# Patient Record
Sex: Female | Born: 1990
Health system: Southern US, Community
[De-identification: ages and names within clinical notes are randomized; demographics above are authoritative.]

## PROBLEM LIST (undated history)

## (undated) ENCOUNTER — Ambulatory Visit: Disposition: A | Discharge status: Home / Self Care | Payer: 59

## (undated) DIAGNOSIS — F32A Depression, unspecified: Secondary | ICD-10-CM

## (undated) DIAGNOSIS — E282 Polycystic ovarian syndrome: Secondary | ICD-10-CM

## (undated) DIAGNOSIS — F329 Major depressive disorder, single episode, unspecified: Secondary | ICD-10-CM

## (undated) DIAGNOSIS — F4310 Post-traumatic stress disorder, unspecified: Secondary | ICD-10-CM

## (undated) DIAGNOSIS — F3281 Premenstrual dysphoric disorder: Secondary | ICD-10-CM

## (undated) DIAGNOSIS — Z800 Family history of malignant neoplasm of digestive organs: Secondary | ICD-10-CM

## (undated) HISTORY — PX: WISDOM TOOTH EXTRACTION: SHX21

## (undated) HISTORY — DX: Family history of malignant neoplasm of digestive organs: Z80.0

## (undated) HISTORY — PX: CYSTECTOMY: SUR359

---
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
## 2015-11-24 ENCOUNTER — Emergency Department
Admission: EM | Admit: 2015-11-24 | Discharge: 2015-11-24 | Disposition: A | Discharge status: Home / Self Care | Payer: PRIVATE HEALTH INSURANCE | Attending: Student in an Organized Health Care Education/Training Program | Admitting: Student in an Organized Health Care Education/Training Program

## 2015-11-24 ENCOUNTER — Encounter: Payer: Self-pay | Admitting: Intensive Care

## 2015-11-24 DIAGNOSIS — F4310 Post-traumatic stress disorder, unspecified: Secondary | ICD-10-CM

## 2015-11-24 DIAGNOSIS — F329 Major depressive disorder, single episode, unspecified: Secondary | ICD-10-CM | POA: Diagnosis present

## 2015-11-24 DIAGNOSIS — R1110 Vomiting, unspecified: Secondary | ICD-10-CM | POA: Insufficient documentation

## 2015-11-24 DIAGNOSIS — F332 Major depressive disorder, recurrent severe without psychotic features: Secondary | ICD-10-CM

## 2015-11-24 DIAGNOSIS — F32A Depression, unspecified: Secondary | ICD-10-CM

## 2015-11-24 HISTORY — DX: Premenstrual dysphoric disorder: F32.81

## 2015-11-24 HISTORY — DX: Depression, unspecified: F32.A

## 2015-11-24 HISTORY — DX: Post-traumatic stress disorder, unspecified: F43.10

## 2015-11-24 HISTORY — DX: Polycystic ovarian syndrome: E28.2

## 2015-11-24 HISTORY — DX: Major depressive disorder, single episode, unspecified: F32.9

## 2015-11-24 LAB — COMPREHENSIVE METABOLIC PANEL
ALBUMIN: 4.4 g/dL (ref 3.5–5.0)
ALT: 24 U/L (ref 14–54)
AST: 20 U/L (ref 15–41)
Alkaline Phosphatase: 51 U/L (ref 38–126)
Anion gap: 7 (ref 5–15)
BILIRUBIN TOTAL: 0.8 mg/dL (ref 0.3–1.2)
BUN: 9 mg/dL (ref 6–20)
CHLORIDE: 106 mmol/L (ref 101–111)
CO2: 26 mmol/L (ref 22–32)
CREATININE: 1.04 mg/dL — AB (ref 0.44–1.00)
Calcium: 9.3 mg/dL (ref 8.9–10.3)
GFR calc Af Amer: >60 mL/min (ref >60)
GFR calc non Af Amer: >60 mL/min (ref >60)
GLUCOSE: 100 mg/dL — AB (ref 65–99)
POTASSIUM: 3.7 mmol/L (ref 3.5–5.1)
Sodium: 139 mmol/L (ref 135–145)
Total Protein: 7.6 g/dL (ref 6.5–8.1)

## 2015-11-24 LAB — URINALYSIS COMPLETE WITH MICROSCOPIC (ARMC ONLY)
BACTERIA UA: NONE SEEN
Bilirubin Urine: NEGATIVE
GLUCOSE, UA: NEGATIVE mg/dL
HGB URINE DIPSTICK: NEGATIVE
Leukocytes, UA: NEGATIVE
Nitrite: NEGATIVE
Protein, ur: NEGATIVE mg/dL
Specific Gravity, Urine: 1.025 (ref 1.005–1.030)
pH: 6 (ref 5.0–8.0)

## 2015-11-24 LAB — CBC
HEMATOCRIT: 45.2 % (ref 35.0–47.0)
Hemoglobin: 15.6 g/dL (ref 12.0–16.0)
MCH: 33.1 pg (ref 26.0–34.0)
MCHC: 34.5 g/dL (ref 32.0–36.0)
MCV: 95.8 fL (ref 80.0–100.0)
PLATELETS: 200 10*3/uL (ref 150–440)
RBC: 4.72 MIL/uL (ref 3.80–5.20)
RDW: 13.8 % (ref 11.5–14.5)
WBC: 8.7 10*3/uL (ref 3.6–11.0)

## 2015-11-24 LAB — POCT PREGNANCY, URINE: PREG TEST UR: NEGATIVE

## 2015-11-24 LAB — LIPASE, BLOOD: LIPASE: 24 U/L (ref 11–51)

## 2015-11-24 MED ORDER — ONDANSETRON 4 MG PO TBDP
ORAL_TABLET | ORAL | Status: AC
  Administered 2015-11-24: 4 mg via ORAL
  Filled 2015-11-24: qty 1

## 2015-11-24 MED ORDER — ONDANSETRON 4 MG PO TBDP
4.0000 mg | ORAL_TABLET | Freq: Once | ORAL | Status: AC | PRN
  Administered 2015-11-24: 4 mg via ORAL

## 2015-11-24 MED ORDER — LORAZEPAM 1 MG PO TABS
1.0000 mg | ORAL_TABLET | Freq: Once | ORAL | Status: AC
  Administered 2015-11-24: 1 mg via ORAL
  Filled 2015-11-24: qty 1

## 2015-11-24 MED ORDER — LORAZEPAM 0.5 MG PO TABS: 0.5000 mg | ORAL_TABLET | Freq: Three times a day (TID) | ORAL | 0 refills | Status: AC | PRN

## 2015-11-24 NOTE — ED Provider Notes (Signed)
[facility] Emergency Department Provider Note    First MD Initiated Contact with Patient 11/24/15 1531     (approximate)  I have reviewed the triage vital signs and the nursing notes.   HISTORY  Chief Complaint Emesis; Abdominal Pain; and Depression    HPI Pamela Kent is a 25 y.o. female history of severe depression, PTSD as well as PMDD not on any antidepressant medications presents with severe anhedonia, insomnia and feelings of grief. Patient just recently graduated from graduate school down in [place].  This has been a very stressful time for the patient she does not have any employment at this time. She also states that her boyfriend committed suicide last week and since then she's been having worsening feelings of grief and uncontrollable crying. She feels hopeless at alone. States that she is also feeling more agitated and irritable with family members and loved ones. Denies any hallucinations. Denies any suicide ideations but will have thoughts that death may be better than the way she currently feels. Does not have a plan.   Past Medical History:  Diagnosis Date  . Depression   . PCOS (polycystic ovarian syndrome)   . PMDD (premenstrual dysphoric disorder)   . PTSD (post-traumatic stress disorder)     There are no active problems to display for this patient.   Past Surgical History:  Procedure Laterality Date  . CYSTECTOMY      Prior to Admission medications   Not on File    Allergies Review of patient's allergies indicates no known allergies.  History reviewed. No pertinent family history.  Social History Social History  Substance Use Topics  . Smoking status: Never Smoker  . Smokeless tobacco: Never Used  . Alcohol use Yes     Comment: Occ on celebrations or holiday    Review of Systems Patient denies headaches, rhinorrhea, blurry vision, numbness, shortness of breath, chest pain, edema, cough, abdominal pain, nausea,  vomiting, diarrhea, dysuria, fevers, rashes or hallucinations unless otherwise stated above in HPI. ____________________________________________   PHYSICAL EXAM:  VITAL SIGNS: Vitals:   11/24/15 1358  BP: 121/85  Pulse: 84  Resp: 20  Temp: 98.6 F (37 C)    Constitutional: Alert and oriented. Well appearing and in no acute distress. Eyes: Conjunctivae are normal. PERRL. EOMI. Head: Atraumatic. Nose: No congestion/rhinnorhea. Mouth/Throat: Mucous membranes are moist.  Oropharynx non-erythematous. Neck: No stridor. Painless ROM. No cervical spine tenderness to palpation Hematological/Lymphatic/Immunilogical: No cervical lymphadenopathy. Cardiovascular: Normal rate, regular rhythm. Grossly normal heart sounds.  Good peripheral circulation. Respiratory: Normal respiratory effort.  No retractions. Lungs CTAB. Gastrointestinal: Soft and nontender. No distention. No abdominal bruits. No CVA tenderness. Genitourinary:  Musculoskeletal: No lower extremity tenderness nor edema.  No joint effusions. Neurologic:  Normal speech and language. No gross focal neurologic deficits are appreciated. No gait instability. Skin:  Skin is warm, dry and intact. No rash noted. Psychiatric: Mood depressed and affect is tearfull. Speech and behavior are normal.  ____________________________________________   LABS (all labs ordered are listed, but only abnormal results are displayed)  Results for orders placed or performed during the hospital encounter of 11/24/15 (from the past 24 hour(s))  Lipase, blood     Status: None   Collection Time: 11/24/15  2:04 PM  Result Value Ref Range   Lipase 24 11 - 51 U/L  Comprehensive metabolic panel     Status: Abnormal   Collection Time: 11/24/15  2:04 PM  Result Value Ref Range   Sodium  139 135 - 145 mmol/L   Potassium 3.7 3.5 - 5.1 mmol/L   Chloride 106 101 - 111 mmol/L   CO2 26 22 - 32 mmol/L   Glucose, Bld 100 (H) 65 - 99 mg/dL   BUN 9 6 - 20 mg/dL    Creatinine, Ser 1.611.04 (H) 0.44 - 1.00 mg/dL   Calcium 9.3 8.9 - 09.610.3 mg/dL   Total Protein 7.6 6.5 - 8.1 g/dL   Albumin 4.4 3.5 - 5.0 g/dL   AST 20 15 - 41 U/L   ALT 24 14 - 54 U/L   Alkaline Phosphatase 51 38 - 126 U/L   Total Bilirubin 0.8 0.3 - 1.2 mg/dL   GFR calc non Af Amer >60 >60 mL/min   GFR calc Af Amer >60 >60 mL/min   Anion gap 7 5 - 15  CBC     Status: None   Collection Time: 11/24/15  2:04 PM  Result Value Ref Range   WBC 8.7 3.6 - 11.0 K/uL   RBC 4.72 3.80 - 5.20 MIL/uL   Hemoglobin 15.6 12.0 - 16.0 g/dL   HCT 04.545.2 40.935.0 - 81.147.0 %   MCV 95.8 80.0 - 100.0 fL   MCH 33.1 26.0 - 34.0 pg   MCHC 34.5 32.0 - 36.0 g/dL   RDW 91.413.8 78.211.5 - 95.614.5 %   Platelets 200 150 - 440 K/uL  Pregnancy, urine POC     Status: None   Collection Time: 11/24/15  3:43 PM  Result Value Ref Range   Preg Test, Ur NEGATIVE NEGATIVE   ____________________________________________  EKG My review and personal interpretation at Time: 14:08   Indication: medication screen  Rate: 60  Rhythm: nsr Axis: normal Other: normal ekg, normal intervals ____________________________________________  RADIOLOGY   ____________________________________________   PROCEDURES  Procedure(s) performed: none    Critical Care performed: no ____________________________________________   INITIAL IMPRESSION / ASSESSMENT AND PLAN / ED COURSE  Pertinent labs & imaging results that were available during my care of the patient were reviewed by me and considered in my medical decision making (see chart for details).  DDX: Psychosis, delirium, medication effect, noncompliance, polysubstance abuse, Si, Hi, depression  Pamela Kent is a 25 y.o. who presents to the ED with for evaluation of severe depression, anhedonia, and grieving.  Patient has psych history of depression, PTSD. She denies any active SI or HI but is clearly suffering quite acute rate deal. Also reporting abdominal pain and nausea that is secondary to  thoughts about her recently deceased boyfriend. Her abdominal exam soft and benign. Laboratory testing was ordered to evaluation for underlying electrolyte derangement or signs of underlying organic pathology to explain today's presentation.  Based on history and physical and laboratory evaluation, it appears that the patient's presentation is 2/2 underlying psychiatric disorder and will require further evaluation and management by inpatient psychiatry.  Patient was not made an IVC due to no active SI or HI.  Disposition pending psychiatric evaluation.  I will consult psychiatry for evaluation and management.   Clinical Course    Patient evaluated by psychiatry at bedside and was offered voluntary admission but the patient declined preferring to restart her medications and follow-up with her psychiatrist in [place].  Have discussed with the patient and available family all diagnostics and treatments performed thus far and all questions were answered to the best of my ability. The patient demonstrates understanding and agreement with plan.  ____________________________________________   FINAL CLINICAL IMPRESSION(S) / ED DIAGNOSES  Final diagnoses:  Depression      NEW MEDICATIONS STARTED DURING THIS VISIT:  New Prescriptions   No medications on file     Note:  This document was prepared using Dragon voice recognition software and may include unintentional dictation errors.    Willy Eddy, MD 11/24/15 2146
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
## 2015-11-24 NOTE — ED Notes (Signed)
Pt denies SI or any thoughts of wanting to hurt herself. Pt stated she was sad and upset and had a flat affect and soft spoken.
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
## 2015-11-24 NOTE — ED Triage Notes (Signed)
Patient states she went to the doctor Friday for not being able to eat or sleep. Doctor diagnosed pt with depression and placed on Remeron. Patient reports "my bf committed suicide last Wednesday and I feel very upset and empty. I cannot control my emotions and the way I feel on the inside is making me very sick" Patient states she is able to keep food and liquids down. Just has emesis when she thinks about everything going on. Patient crying in triage.

## 2016-03-05 ENCOUNTER — Emergency Department: Payer: PRIVATE HEALTH INSURANCE

## 2016-03-05 ENCOUNTER — Emergency Department
Admission: EM | Admit: 2016-03-05 | Discharge: 2016-03-05 | Disposition: A | Discharge status: Home / Self Care | Payer: PRIVATE HEALTH INSURANCE | Attending: Emergency Medicine | Admitting: Emergency Medicine

## 2016-03-05 ENCOUNTER — Encounter: Payer: Self-pay | Admitting: Intensive Care

## 2016-03-05 DIAGNOSIS — Y999 Unspecified external cause status: Secondary | ICD-10-CM | POA: Insufficient documentation

## 2016-03-05 DIAGNOSIS — S0990XA Unspecified injury of head, initial encounter: Secondary | ICD-10-CM | POA: Insufficient documentation

## 2016-03-05 DIAGNOSIS — Y929 Unspecified place or not applicable: Secondary | ICD-10-CM | POA: Insufficient documentation

## 2016-03-05 DIAGNOSIS — M25512 Pain in left shoulder: Secondary | ICD-10-CM | POA: Insufficient documentation

## 2016-03-05 DIAGNOSIS — Y9389 Activity, other specified: Secondary | ICD-10-CM | POA: Insufficient documentation

## 2016-03-05 LAB — COMPREHENSIVE METABOLIC PANEL
ALBUMIN: 3.9 g/dL (ref 3.5–5.0)
ALK PHOS: 61 U/L (ref 38–126)
ALT: 25 U/L (ref 14–54)
ANION GAP: 6 (ref 5–15)
AST: 25 U/L (ref 15–41)
BILIRUBIN TOTAL: 0.3 mg/dL (ref 0.3–1.2)
BUN: 8 mg/dL (ref 6–20)
CALCIUM: 8.9 mg/dL (ref 8.9–10.3)
CO2: 23 mmol/L (ref 22–32)
Chloride: 109 mmol/L (ref 101–111)
Creatinine, Ser: 0.94 mg/dL (ref 0.44–1.00)
GFR calc non Af Amer: >60 mL/min (ref >60)
Glucose, Bld: 93 mg/dL (ref 65–99)
POTASSIUM: 3.9 mmol/L (ref 3.5–5.1)
SODIUM: 138 mmol/L (ref 135–145)
TOTAL PROTEIN: 7.4 g/dL (ref 6.5–8.1)

## 2016-03-05 LAB — CBC
HEMATOCRIT: 44.9 % (ref 35.0–47.0)
HEMOGLOBIN: 15.2 g/dL (ref 12.0–16.0)
MCH: 32.5 pg (ref 26.0–34.0)
MCHC: 33.7 g/dL (ref 32.0–36.0)
MCV: 96.3 fL (ref 80.0–100.0)
Platelets: 225 10*3/uL (ref 150–440)
RBC: 4.67 MIL/uL (ref 3.80–5.20)
RDW: 13.4 % (ref 11.5–14.5)
WBC: 14.5 10*3/uL — ABNORMAL HIGH (ref 3.6–11.0)

## 2016-03-05 MED ORDER — TRAMADOL HCL 50 MG PO TABS
50.0000 mg | ORAL_TABLET | Freq: Once | ORAL | Status: AC
  Administered 2016-03-05: 50 mg via ORAL
  Filled 2016-03-05: qty 1

## 2016-03-05 MED ORDER — OXYCODONE-ACETAMINOPHEN 5-325 MG PO TABS: 1.0000 | ORAL_TABLET | Freq: Four times a day (QID) | ORAL | 0 refills | Status: DC | PRN

## 2016-03-05 MED ORDER — OXYCODONE-ACETAMINOPHEN 5-325 MG PO TABS
1.0000 | ORAL_TABLET | Freq: Once | ORAL | Status: AC
  Administered 2016-03-05: 1 via ORAL
  Filled 2016-03-05: qty 1
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
## 2016-03-05 NOTE — ED Notes (Signed)
[employer] in room

## 2016-05-22 ENCOUNTER — Encounter (HOSPITAL_COMMUNITY): Payer: Self-pay | Admitting: Emergency Medicine

## 2016-05-22 ENCOUNTER — Emergency Department (HOSPITAL_COMMUNITY)
Admission: EM | Admit: 2016-05-22 | Discharge: 2016-05-22 | Disposition: A | Discharge status: Home / Self Care | Payer: PRIVATE HEALTH INSURANCE | Attending: Emergency Medicine | Admitting: Emergency Medicine

## 2016-05-22 DIAGNOSIS — R109 Unspecified abdominal pain: Secondary | ICD-10-CM

## 2016-05-22 DIAGNOSIS — R195 Other fecal abnormalities: Secondary | ICD-10-CM | POA: Diagnosis present

## 2016-05-22 LAB — CBC
HEMATOCRIT: 42.4 % (ref 36.0–46.0)
HEMOGLOBIN: 14.4 g/dL (ref 12.0–15.0)
MCH: 32.4 pg (ref 26.0–34.0)
MCHC: 34 g/dL (ref 30.0–36.0)
MCV: 95.5 fL (ref 78.0–100.0)
Platelets: 222 10*3/uL (ref 150–400)
RBC: 4.44 MIL/uL (ref 3.87–5.11)
RDW: 14.2 % (ref 11.5–15.5)
WBC: 6.6 10*3/uL (ref 4.0–10.5)

## 2016-05-22 LAB — POC OCCULT BLOOD, ED: FECAL OCCULT BLD: NEGATIVE

## 2016-05-22 LAB — COMPREHENSIVE METABOLIC PANEL
ALT: 55 U/L — ABNORMAL HIGH (ref 14–54)
ANION GAP: 5 (ref 5–15)
AST: 40 U/L (ref 15–41)
Albumin: 3.7 g/dL (ref 3.5–5.0)
Alkaline Phosphatase: 55 U/L (ref 38–126)
BUN: 8 mg/dL (ref 6–20)
CHLORIDE: 107 mmol/L (ref 101–111)
CO2: 25 mmol/L (ref 22–32)
Calcium: 8.9 mg/dL (ref 8.9–10.3)
Creatinine, Ser: 0.91 mg/dL (ref 0.44–1.00)
GFR calc Af Amer: >60 mL/min (ref >60)
GFR calc non Af Amer: >60 mL/min (ref >60)
Glucose, Bld: 101 mg/dL — ABNORMAL HIGH (ref 65–99)
POTASSIUM: 3.9 mmol/L (ref 3.5–5.1)
Sodium: 137 mmol/L (ref 135–145)
TOTAL PROTEIN: 7 g/dL (ref 6.5–8.1)
Total Bilirubin: 0.6 mg/dL (ref 0.3–1.2)

## 2016-05-22 LAB — LIPASE, BLOOD: LIPASE: 18 U/L (ref 11–51)

## 2016-05-22 LAB — I-STAT BETA HCG BLOOD, ED (MC, WL, AP ONLY)

## 2016-05-22 MED ORDER — DICYCLOMINE HCL 20 MG PO TABS: 20.0000 mg | ORAL_TABLET | Freq: Two times a day (BID) | ORAL | 0 refills | Status: DC

## 2016-05-22 NOTE — ED Provider Notes (Signed)
[facility]-EMERGENCY DEPT Provider Note   CSN: [id number] Arrival date & time: 05/22/16  [id number]     History   Chief Complaint Chief Complaint  Patient presents with  . Blood In Stools    HPI Pamela Kent is a 26 y.o. female.  HPI Pt has been having some stomach cramping.  This started on Thursday.  She noticed dark color to her stools the last couple of days.  She usually has loose stools but no change in that pattern. She googled and read it could be blood so she came to get checked.  No fevers.  No vomiting.   No pepto bismol.  Has been eating more fast food and restaurant food recently.  No pain or symptoms now.  Past Medical History:  Diagnosis Date  . Depression   . PCOS (polycystic ovarian syndrome)   . PMDD (premenstrual dysphoric disorder)   . PTSD (post-traumatic stress disorder)     Patient Active Problem List   Diagnosis Date Noted  . Severe recurrent major depression without psychotic features (HCC) 11/24/2015  . PTSD (post-traumatic stress disorder) 11/24/2015    Past Surgical History:  Procedure Laterality Date  . CYSTECTOMY      OB History    No data available       Home Medications    Prior to Admission medications   Medication Sig Start Date End Date Taking? Authorizing Provider  dicyclomine (BENTYL) 20 MG tablet Take 1 tablet (20 mg total) by mouth 2 (two) times daily. 05/22/16   Linwood DibblesJon Elvin Banker, MD  LORazepam (ATIVAN) 0.5 MG tablet Take 1 tablet (0.5 mg total) by mouth every 8 (eight) hours as needed for anxiety. 11/24/15 11/23/16  Willy EddyPatrick Robinson, MD  oxyCODONE-acetaminophen (ROXICET) 5-325 MG tablet Take 1 tablet by mouth every 6 (six) hours as needed for severe pain. 03/05/16   Enid DerryAshley Wagner, PA-C    Family History No family history on file.  Social History Social History  Substance Use Topics  . Smoking status: Never Smoker  . Smokeless tobacco: Never Used  . Alcohol use Yes     Comment: Occ on celebrations or holiday     Allergies     Patient has no known allergies.   Review of Systems Review of Systems  All other systems reviewed and are negative.    Physical Exam Updated Vital Signs BP 126/80 (BP Location: Left Arm)   Pulse 60   Temp 97.6 F (36.4 C) (Oral)   Resp 18   Ht 5\' 4"  (1.626 m)   Wt 76.2 kg   LMP 05/13/2016   SpO2 100%   BMI 28.84 kg/m   Physical Exam  Constitutional: She appears well-developed and well-nourished. No distress.  HENT:  Head: Normocephalic and atraumatic.  Right Ear: External ear normal.  Left Ear: External ear normal.  Eyes: Conjunctivae are normal. Right eye exhibits no discharge. Left eye exhibits no discharge. No scleral icterus.  Neck: Neck supple. No tracheal deviation present.  Cardiovascular: Normal rate, regular rhythm and intact distal pulses.   Pulmonary/Chest: Effort normal and breath sounds normal. No stridor. No respiratory distress. She has no wheezes. She has no rales.  Abdominal: Soft. Bowel sounds are normal. She exhibits no distension. There is no tenderness. There is no rebound and no guarding.  Genitourinary:  Genitourinary Comments: Tremper stool, no blood  Musculoskeletal: She exhibits no edema or tenderness.  Neurological: She is alert. She has normal strength. No cranial nerve deficit (no facial droop, extraocular movements intact, no  slurred speech) or sensory deficit. She exhibits normal muscle tone. She displays no seizure activity. Coordination normal.  Skin: Skin is warm and dry. No rash noted.  Psychiatric: She has a normal mood and affect.  Nursing note and vitals reviewed.    ED Treatments / Results    Procedures Procedures (including critical care time)  Medications Ordered in ED Medications - No data to display   Initial Impression / Assessment and Plan / ED Course  I have reviewed the triage vital signs and the nursing notes.  Pertinent labs & imaging results that were available during my care of the patient were reviewed by me  and considered in my medical decision making (see chart for details).   patient's laboratory tests are reassuring. She is not anemic. Hemoccult test is negative.  Her abdominal exam is benign. I doubt diverticulitis, colitis, appendicitis or other serious condition.  Will dc home with bentyl.  Follow up with a PCP prn  Final Clinical Impressions(s) / ED Diagnoses   Final diagnoses:  Abdominal cramping    New Prescriptions New Prescriptions   DICYCLOMINE (BENTYL) 20 MG TABLET    Take 1 tablet (20 mg total) by mouth 2 (two) times daily.     Linwood Dibbles, MD 05/22/16 [phone]

## 2016-05-22 NOTE — ED Triage Notes (Signed)
Pt complaint of loose black stools since last Thursday; pt denies n/v. Hx of same with "intestinal infection."

## 2016-05-22 NOTE — ED Notes (Signed)
RN at bedside starting IV and collecting labs

## 2016-05-22 NOTE — Discharge Instructions (Signed)
take the medication as needed for cramping, you can also take Tylenol or ibuprofen,  Follow-up with a primary care doctor,

## 2016-10-18 ENCOUNTER — Emergency Department
Admission: EM | Admit: 2016-10-18 | Discharge: 2016-10-18 | Disposition: A | Discharge status: Home / Self Care | Payer: PRIVATE HEALTH INSURANCE | Attending: Emergency Medicine | Admitting: Emergency Medicine

## 2016-10-18 ENCOUNTER — Encounter: Payer: Self-pay | Admitting: Emergency Medicine

## 2016-10-18 DIAGNOSIS — F332 Major depressive disorder, recurrent severe without psychotic features: Secondary | ICD-10-CM | POA: Insufficient documentation

## 2016-10-18 DIAGNOSIS — Z79899 Other long term (current) drug therapy: Secondary | ICD-10-CM | POA: Insufficient documentation

## 2016-10-18 DIAGNOSIS — R519 Headache, unspecified: Secondary | ICD-10-CM

## 2016-10-18 DIAGNOSIS — E860 Dehydration: Secondary | ICD-10-CM | POA: Insufficient documentation

## 2016-10-18 DIAGNOSIS — R51 Headache: Secondary | ICD-10-CM | POA: Insufficient documentation

## 2016-10-18 DIAGNOSIS — R112 Nausea with vomiting, unspecified: Secondary | ICD-10-CM | POA: Insufficient documentation

## 2016-10-18 LAB — URINALYSIS, COMPLETE (UACMP) WITH MICROSCOPIC
BACTERIA UA: NONE SEEN
BILIRUBIN URINE: NEGATIVE
Glucose, UA: NEGATIVE mg/dL
Hgb urine dipstick: NEGATIVE
KETONES UR: 5 mg/dL — AB
LEUKOCYTES UA: NEGATIVE
Nitrite: NEGATIVE
Protein, ur: NEGATIVE mg/dL
SPECIFIC GRAVITY, URINE: 1.025 (ref 1.005–1.030)
pH: 5 (ref 5.0–8.0)

## 2016-10-18 LAB — CBC
HEMATOCRIT: 45.3 % (ref 35.0–47.0)
Hemoglobin: 15.3 g/dL (ref 12.0–16.0)
MCH: 32.5 pg (ref 26.0–34.0)
MCHC: 33.7 g/dL (ref 32.0–36.0)
MCV: 96.4 fL (ref 80.0–100.0)
Platelets: 186 10*3/uL (ref 150–440)
RBC: 4.7 MIL/uL (ref 3.80–5.20)
RDW: 14.2 % (ref 11.5–14.5)
WBC: 7.5 10*3/uL (ref 3.6–11.0)

## 2016-10-18 LAB — COMPREHENSIVE METABOLIC PANEL
ALBUMIN: 4.7 g/dL (ref 3.5–5.0)
ALT: 53 U/L (ref 14–54)
AST: 37 U/L (ref 15–41)
Alkaline Phosphatase: 50 U/L (ref 38–126)
Anion gap: 8 (ref 5–15)
BUN: 15 mg/dL (ref 6–20)
CHLORIDE: 105 mmol/L (ref 101–111)
CO2: 26 mmol/L (ref 22–32)
Calcium: 9.5 mg/dL (ref 8.9–10.3)
Creatinine, Ser: 0.97 mg/dL (ref 0.44–1.00)
GFR calc Af Amer: >60 mL/min (ref >60)
Glucose, Bld: 86 mg/dL (ref 65–99)
POTASSIUM: 3.7 mmol/L (ref 3.5–5.1)
SODIUM: 139 mmol/L (ref 135–145)
Total Bilirubin: 1 mg/dL (ref 0.3–1.2)
Total Protein: 8.3 g/dL — ABNORMAL HIGH (ref 6.5–8.1)

## 2016-10-18 LAB — LIPASE, BLOOD: LIPASE: 22 U/L (ref 11–51)

## 2016-10-18 LAB — CK: CK TOTAL: 369 U/L — AB (ref 38–234)

## 2016-10-18 LAB — POCT PREGNANCY, URINE: PREG TEST UR: NEGATIVE

## 2016-10-18 MED ORDER — DIPHENHYDRAMINE HCL 50 MG/ML IJ SOLN
INTRAMUSCULAR | Status: AC
  Filled 2016-10-18: qty 1

## 2016-10-18 MED ORDER — SODIUM CHLORIDE 0.9 % IV BOLUS (SEPSIS): 1000.0000 mL | Freq: Once | INTRAVENOUS | Status: DC

## 2016-10-18 MED ORDER — METOCLOPRAMIDE HCL 5 MG/ML IJ SOLN
10.0000 mg | Freq: Once | INTRAMUSCULAR | Status: DC
  Filled 2016-10-18: qty 2

## 2016-10-18 MED ORDER — KETOROLAC TROMETHAMINE 30 MG/ML IJ SOLN
30.0000 mg | Freq: Once | INTRAMUSCULAR | Status: DC
  Filled 2016-10-18: qty 1

## 2016-10-18 NOTE — Discharge Instructions (Signed)
Downtime D/C

## 2016-10-18 NOTE — ED Provider Notes (Signed)
[facility] Emergency Department Provider Note  Time seen: 2:23 PM  I have reviewed the triage vital signs and the nursing notes.   HISTORY  Chief Complaint Weakness and Emesis    HPI Pamela Kent is a 26 y.o. female with a past medical history of depression, presents the emergency department for nausea and weakness and headache. According to the patient she was outside working in the yard yesterday mowing the lawn for the first time ever. States she was getting extremely fatigued and hot. States she had to go inside and take a cold shower because she was feeling overheated. She began feeling nauseated and had several episodes of vomiting. Patient states she developed a mild headache last night which appeared to worsen today. States she does not recall some conversations she was having last night, but states she was feeling very fatigued at the time. Denies any weakness or numbness. Denies any confusion or difficulty thinking or speaking. Patient states continued headache which she states is a 5/10 dull headache.  Past Medical History:  Diagnosis Date  . Depression   . PCOS (polycystic ovarian syndrome)   . PMDD (premenstrual dysphoric disorder)   . PTSD (post-traumatic stress disorder)     Patient Active Problem List   Diagnosis Date Noted  . Severe recurrent major depression without psychotic features (HCC) 11/24/2015  . PTSD (post-traumatic stress disorder) 11/24/2015    Past Surgical History:  Procedure Laterality Date  . CYSTECTOMY      Prior to Admission medications   Medication Sig Start Date End Date Taking? Authorizing Provider  dicyclomine (BENTYL) 20 MG tablet Take 1 tablet (20 mg total) by mouth 2 (two) times daily. 05/22/16   Linwood DibblesKnapp, Jon, MD  LORazepam (ATIVAN) 0.5 MG tablet Take 1 tablet (0.5 mg total) by mouth every 8 (eight) hours as needed for anxiety. 11/24/15 11/23/16  Willy Eddyobinson, Patrick, MD  oxyCODONE-acetaminophen (ROXICET) 5-325 MG  tablet Take 1 tablet by mouth every 6 (six) hours as needed for severe pain. 03/05/16   Enid DerryWagner, Ashley, PA-C    No Known Allergies  No family history on file.  Social History Social History  Substance Use Topics  . Smoking status: Never Smoker  . Smokeless tobacco: Never Used  . Alcohol use Yes     Comment: Occ on celebrations or holiday    Review of Systems Constitutional: Negative for fever.Positive for generalized weakness. Cardiovascular: Negative for chest pain. Respiratory: Negative for shortness of breath. Gastrointestinal: Negative for abdominal pain. Positive for nausea and vomiting last night. Genitourinary: Negative for dysuria. Neurological: Moderate headache. Denies focal weakness or numbness All other ROS negative  ____________________________________________   PHYSICAL EXAM:  VITAL SIGNS: ED Triage Vitals  Enc Vitals Group     BP 10/18/16 1210 (!) 137/95     Pulse Rate 10/18/16 1210 65     Resp 10/18/16 1210 18     Temp 10/18/16 1210 98.3 F (36.8 C)     Temp Source 10/18/16 1210 Oral     SpO2 10/18/16 1210 100 %     Weight 10/18/16 1211 180 lb (81.6 kg)     Height 10/18/16 1211 5\' 4"  (1.626 m)     Head Circumference --      Peak Flow --      Pain Score 10/18/16 1211 8     Pain Loc --      Pain Edu? --      Excl. in GC? --     Constitutional: Alert  and oriented. Well appearing and in no distress. Eyes: Normal exam ENT   Head: Normocephalic and atraumatic.   Mouth/Throat: Mucous membranes are moist. Cardiovascular: Normal rate, regular rhythm. No murmur Respiratory: Normal respiratory effort without tachypnea nor retractions. Breath sounds are clear Gastrointestinal: Soft and nontender. No distention.   Musculoskeletal: Nontender with normal range of motion in all extremities Neurologic:  Normal speech and language. No gross focal neurologic deficits Skin:  Skin is warm, dry and intact.  Psychiatric: Mood and affect are normal.    ____________________________________________    EKG  EKG reviewed and interpreted by myself shows normal sinus rhythm at 60 bpm, narrow QRS, normal axis, normal intervals, no concerning ST changes. Normal EKG.  ____________________________________________    INITIAL IMPRESSION / ASSESSMENT AND PLAN / ED COURSE  Pertinent labs & imaging results that were available during my care of the patient were reviewed by me and considered in my medical decision making (see chart for details).  Patient presents the emergency department generalized fatigue weakness, nausea and vomiting yesterday, moderate headache today. Patient believes she became overheated yesterday while working in the yard and believes her symptoms are due to that. Currently the patient appears well, normal exam, normal vitals, normal EKG. Patient's labs are normal besides a slight amount of ketones in the urine. We will check a creatinine kinase level. We will IV hydrate and treat with headache medications Toradol and Reglan. Overall the patient appears very well, nontoxic.  Patient states complete resolution of headache after medications. Patient states she is feeling much better. We will discharge home with supportive care and discussed Tylenol or Motrin as needed for any further headache and drinking plenty of fluids and obtaining plenty of rest. Patient agreeable.  ____________________________________________   FINAL CLINICAL IMPRESSION(S) / ED DIAGNOSES  Heat exhaustion Nausea vomiting Headache   [name], MD 10/18/16 1704

## 2016-10-18 NOTE — ED Triage Notes (Signed)
Patient reports being outside mowing all day yesterday. States when she came inside, she started having weakness, nausea and vomiting. Patient also states that she doesn't remember several things that happened last night. Upon arrival to ED patient is ambulatory with steady gait, alert and oriented x4 with NAD noted.
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
## 2017-09-15 ENCOUNTER — Encounter (HOSPITAL_COMMUNITY): Payer: Self-pay

## 2017-09-15 ENCOUNTER — Emergency Department (HOSPITAL_COMMUNITY)
Admission: EM | Admit: 2017-09-15 | Discharge: 2017-09-15 | Disposition: A | Discharge status: Home / Self Care | Payer: 59 | Attending: Emergency Medicine | Admitting: Emergency Medicine

## 2017-09-15 ENCOUNTER — Other Ambulatory Visit: Payer: Self-pay

## 2017-09-15 DIAGNOSIS — N3000 Acute cystitis without hematuria: Secondary | ICD-10-CM | POA: Insufficient documentation

## 2017-09-15 DIAGNOSIS — R509 Fever, unspecified: Secondary | ICD-10-CM | POA: Diagnosis present

## 2017-09-15 LAB — URINALYSIS, ROUTINE W REFLEX MICROSCOPIC
Bilirubin Urine: NEGATIVE
Glucose, UA: NEGATIVE mg/dL
HGB URINE DIPSTICK: NEGATIVE
KETONES UR: NEGATIVE mg/dL
NITRITE: POSITIVE — AB
PH: 6 (ref 5.0–8.0)
Protein, ur: NEGATIVE mg/dL
Specific Gravity, Urine: 1.02 (ref 1.005–1.030)

## 2017-09-15 MED ORDER — CEPHALEXIN 500 MG PO CAPS: 500.0000 mg | ORAL_CAPSULE | Freq: Four times a day (QID) | ORAL | 0 refills | Status: DC

## 2017-09-15 MED ORDER — SODIUM CHLORIDE 0.9 % IV BOLUS (SEPSIS)
1000.0000 mL | Freq: Once | INTRAVENOUS | Status: AC
  Administered 2017-09-15: 1000 mL via INTRAVENOUS

## 2017-09-15 MED ORDER — SODIUM CHLORIDE 0.9 % IV SOLN
1.0000 g | Freq: Once | INTRAVENOUS | Status: AC
  Administered 2017-09-15: 1 g via INTRAVENOUS
  Filled 2017-09-15: qty 10

## 2017-09-15 MED ORDER — FLUCONAZOLE 150 MG PO TABS: 150.0000 mg | ORAL_TABLET | Freq: Once | ORAL | 0 refills | Status: AC

## 2017-09-15 MED ORDER — KETOROLAC TROMETHAMINE 30 MG/ML IJ SOLN
15.0000 mg | Freq: Once | INTRAMUSCULAR | Status: AC
  Administered 2017-09-15: 15 mg via INTRAVENOUS
  Filled 2017-09-15: qty 1

## 2017-09-15 NOTE — ED Triage Notes (Signed)
Headache, body aches, fever onset today.  Pt taking ibuprofen occasionally, no tylenol.  Pt had chills which caused her to come to the e.d.

## 2017-09-15 NOTE — ED Provider Notes (Signed)
[facility] EMERGENCY DEPARTMENT Provider Note   CSN: [id number] Arrival date & time: 09/15/17  0155     History   Chief Complaint Chief Complaint  Patient presents with  . Fever    HPI Pamela Kent is a 27 y.o. female.  The history is provided by the patient.  Fever   This is a new problem. The current episode started 12 to 24 hours ago. The problem occurs constantly. The problem has been gradually worsening. The maximum temperature noted was 101 to 101.9 F. Associated symptoms include diarrhea, headaches, sore throat and muscle aches. Pertinent negatives include no vomiting and no cough.  Patient reports she has been having a headache for several days.  Over the past 12 to 24 hours she has developed a fever up to 101.  No vomiting, but she does have diarrhea.  She endorses sore throat.  She also reports body aches. No recent travel, no tick bites, no rash.  No dysuria.  No vaginal bleeding or discharge.  She does endorse back pain.  Past Medical History:  Diagnosis Date  . Depression   . PCOS (polycystic ovarian syndrome)   . PMDD (premenstrual dysphoric disorder)   . PTSD (post-traumatic stress disorder)     Patient Active Problem List   Diagnosis Date Noted  . Severe recurrent major depression without psychotic features (HCC) 11/24/2015  . PTSD (post-traumatic stress disorder) 11/24/2015    Past Surgical History:  Procedure Laterality Date  . CYSTECTOMY       OB History   None      Home Medications    Prior to Admission medications   Medication Sig Start Date End Date Taking? Authorizing Provider  cephALEXin (KEFLEX) 500 MG capsule Take 1 capsule (500 mg total) by mouth 4 (four) times daily. 09/15/17   Zadie Rhine, MD  dicyclomine (BENTYL) 20 MG tablet Take 1 tablet (20 mg total) by mouth 2 (two) times daily. 05/22/16   Linwood Dibbles, MD  fluconazole (DIFLUCAN) 150 MG tablet Take 1 tablet (150 mg total) by mouth once for 1 dose. 09/15/17 09/15/17  Zadie Rhine, MD  oxyCODONE-acetaminophen (ROXICET) 5-325 MG tablet Take 1 tablet by mouth every 6 (six) hours as needed for severe pain. 03/05/16   Enid Derry, PA-C    Family History No family history on file.  Social History Social History   Tobacco Use  . Smoking status: Never Smoker  . Smokeless tobacco: Never Used  Substance Use Topics  . Alcohol use: Yes    Comment: Occ on celebrations or holiday  . Drug use: Yes    Types: Marijuana     Allergies   Patient has no known allergies.   Review of Systems Review of Systems  Constitutional: Positive for fever.  HENT: Positive for sore throat.   Respiratory: Negative for cough.   Gastrointestinal: Positive for diarrhea. Negative for vomiting.  Genitourinary: Negative for dysuria, vaginal bleeding and vaginal discharge.  Neurological: Positive for headaches.  All other systems reviewed and are negative.    Physical Exam Updated Vital Signs BP 107/65 (BP Location: Left Arm)   Pulse 78   Temp 100.1 F (37.8 C) (Oral)   Resp 14   LMP 08/30/2017 (Exact Date)   SpO2 98%   Physical Exam CONSTITUTIONAL: Well developed/well nourished HEAD: Normocephalic/atraumatic EYES: EOMI/PERRL ENMT: Mucous membranes moist, uvula midline without erythema or exudates NECK: supple no meningeal signs SPINE/BACK:entire spine nontender CV: S1/S2 noted, no murmurs/rubs/gallops noted LUNGS: Lungs are clear to auscultation  bilaterally, no apparent distress ABDOMEN: soft, nontender, no rebound or guarding, bowel sounds noted throughout abdomen GU:no cva tenderness NEURO: Pt is awake/alert/appropriate, moves all extremitiesx4.  No facial droop.   EXTREMITIES: pulses normal/equal, full ROM SKIN: warm, color normal PSYCH: no abnormalities of mood noted, alert and oriented to situation   ED Treatments / Results  Labs (all labs ordered are listed, but only abnormal results are displayed) Labs Reviewed  URINALYSIS, ROUTINE W REFLEX  MICROSCOPIC - Abnormal; Notable for the following components:      Result Value   APPearance CLOUDY (*)    Nitrite POSITIVE (*)    Leukocytes, UA SMALL (*)    Bacteria, UA RARE (*)    All other components within normal limits  POC URINE PREG, ED    EKG None  Radiology No results found.  Procedures Procedures (including critical care time)  Medications Ordered in ED Medications  sodium chloride 0.9 % bolus 1,000 mL (0 mLs Intravenous Stopped 09/15/17 0526)  cefTRIAXone (ROCEPHIN) 1 g in sodium chloride 0.9 % 100 mL IVPB (0 g Intravenous Stopped 09/15/17 0452)  ketorolac (TORADOL) 30 MG/ML injection 15 mg (15 mg Intravenous Given 09/15/17 0407)     Initial Impression / Assessment and Plan / ED Course  I have reviewed the triage vital signs and the nursing notes.  Pertinent labs  results that were available during my care of the patient were reviewed by me and considered in my medical decision making (see chart for details).     Patient Found to have UTI.  She is not septic appearing.  She is awake alert in no distress.  She reports her headache is resolved.  She has no meningeal signs. Plan to treat for UTI.  She also had symptoms of viral illness with diarrhea, sore throat and headache. I feel she is appropriate for discharge and outpatient management.  We discussed strict return precautions  Final Clinical Impressions(s) / ED Diagnoses   Final diagnoses:  Acute cystitis without hematuria    ED Discharge Orders        Ordered    cephALEXin (KEFLEX) 500 MG capsule  4 times daily     09/15/17 0514    fluconazole (DIFLUCAN) 150 MG tablet   Once     09/15/17 [id number]       Zadie RhineWickline, Telena Peyser, MD 09/15/17 [phone]

## 2017-09-15 NOTE — ED Notes (Signed)
Pregnancy test negative

## 2018-04-22 ENCOUNTER — Other Ambulatory Visit: Payer: Self-pay

## 2018-04-22 ENCOUNTER — Encounter: Payer: Self-pay | Admitting: Emergency Medicine

## 2018-04-22 DIAGNOSIS — J111 Influenza due to unidentified influenza virus with other respiratory manifestations: Secondary | ICD-10-CM | POA: Insufficient documentation

## 2018-04-22 DIAGNOSIS — Z79899 Other long term (current) drug therapy: Secondary | ICD-10-CM | POA: Insufficient documentation

## 2018-04-22 NOTE — ED Triage Notes (Signed)
Patient ambulatory to triage with steady gait, without difficulty or distress noted, mask in place; pt reports feels her heart fluttering abd pain to left side of chest radiating into neck

## 2018-04-23 ENCOUNTER — Emergency Department: Payer: Self-pay

## 2018-04-23 ENCOUNTER — Emergency Department
Admission: EM | Admit: 2018-04-23 | Discharge: 2018-04-23 | Disposition: A | Discharge status: Home / Self Care | Payer: Self-pay | Attending: Emergency Medicine | Admitting: Emergency Medicine

## 2018-04-23 DIAGNOSIS — R69 Illness, unspecified: Secondary | ICD-10-CM

## 2018-04-23 DIAGNOSIS — J111 Influenza due to unidentified influenza virus with other respiratory manifestations: Secondary | ICD-10-CM

## 2018-04-23 LAB — COMPREHENSIVE METABOLIC PANEL
ALT: 34 U/L (ref 0–44)
ANION GAP: 4 — AB (ref 5–15)
AST: 23 U/L (ref 15–41)
Albumin: 4.3 g/dL (ref 3.5–5.0)
Alkaline Phosphatase: 46 U/L (ref 38–126)
BILIRUBIN TOTAL: 0.6 mg/dL (ref 0.3–1.2)
BUN: 12 mg/dL (ref 6–20)
CHLORIDE: 107 mmol/L (ref 98–111)
CO2: 25 mmol/L (ref 22–32)
Calcium: 8.6 mg/dL — ABNORMAL LOW (ref 8.9–10.3)
Creatinine, Ser: 0.79 mg/dL (ref 0.44–1.00)
Glucose, Bld: 79 mg/dL (ref 70–99)
POTASSIUM: 3.8 mmol/L (ref 3.5–5.1)
Sodium: 136 mmol/L (ref 135–145)
TOTAL PROTEIN: 7.6 g/dL (ref 6.5–8.1)

## 2018-04-23 LAB — CBC WITH DIFFERENTIAL/PLATELET
Abs Immature Granulocytes: 0.02 10*3/uL (ref 0.00–0.07)
BASOS PCT: 1 %
Basophils Absolute: 0 10*3/uL (ref 0.0–0.1)
EOS ABS: 0.1 10*3/uL (ref 0.0–0.5)
EOS PCT: 1 %
HCT: 41.1 % (ref 36.0–46.0)
Hemoglobin: 14 g/dL (ref 12.0–15.0)
IMMATURE GRANULOCYTES: 0 %
Lymphocytes Relative: 41 %
Lymphs Abs: 3.3 10*3/uL (ref 0.7–4.0)
MCH: 32.5 pg (ref 26.0–34.0)
MCHC: 34.1 g/dL (ref 30.0–36.0)
MCV: 95.4 fL (ref 80.0–100.0)
MONO ABS: 0.7 10*3/uL (ref 0.1–1.0)
Monocytes Relative: 9 %
NEUTROS PCT: 48 %
Neutro Abs: 3.8 10*3/uL (ref 1.7–7.7)
PLATELETS: 201 10*3/uL (ref 150–400)
RBC: 4.31 MIL/uL (ref 3.87–5.11)
RDW: 13 % (ref 11.5–15.5)
WBC: 7.9 10*3/uL (ref 4.0–10.5)
nRBC: 0 % (ref 0.0–0.2)

## 2018-04-23 LAB — TROPONIN I

## 2018-04-23 NOTE — ED Notes (Signed)
Pt reports sinus congestion , cough and chest aching.  Pt states she took otc cough med today x 2 doses and now chest hurts.  cig smoker.  Nonproductive cough.  Pt has watery eyes.  Pt alert  Speech clear.
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
## 2018-04-23 NOTE — ED Provider Notes (Signed)
[facility] Emergency Department Provider Note  ____________________________________________   First MD Initiated Contact with Patient 04/23/18 0154     (approximate)  I have reviewed the triage vital signs and the nursing notes.   HISTORY  Chief Complaint Palpitations    HPI Pamela Kent is a 28 y.o. female with psychiatric and medical history as listed below who presents for evaluation of a variety of symptoms that includes general malaise, fatigue, body aches, nasal congestion, ear fullness, mild sore throat, nausea, chest pain and pressure associated with cough and deep breathing, some shortness of breath with exertion, decreased appetite, and palpitations.  The symptoms have been going on for about 4-5 days and have been gradually getting worse.  She has been able to eat and drink but has had less interest in doing so.  She has been taking some over-the-counter multisymptom cold and flu medication but she is not sure if it is helping.  She came in tonight because the palpitations seemed worse.  She has no cardiac history, does not use estrogen, does not smoke tobacco, no recent immobilizations or surgeries, no history of blood clots in the legs of the lungs.  No history of heart disease.  Symptoms are severe.  Past Medical History:  Diagnosis Date  . Depression   . PCOS (polycystic ovarian syndrome)   . PMDD (premenstrual dysphoric disorder)   . PTSD (post-traumatic stress disorder)     Patient Active Problem List   Diagnosis Date Noted  . Severe recurrent major depression without psychotic features (HCC) 11/24/2015  . PTSD (post-traumatic stress disorder) 11/24/2015    Past Surgical History:  Procedure Laterality Date  . CYSTECTOMY      Prior to Admission medications   Medication Sig Start Date End Date Taking? Authorizing Provider  cephALEXin (KEFLEX) 500 MG capsule Take 1 capsule (500 mg total) by mouth 4 (four) times daily. 09/15/17    Zadie Rhine, MD  dicyclomine (BENTYL) 20 MG tablet Take 1 tablet (20 mg total) by mouth 2 (two) times daily. 05/22/16   Linwood Dibbles, MD  oxyCODONE-acetaminophen (ROXICET) 5-325 MG tablet Take 1 tablet by mouth every 6 (six) hours as needed for severe pain. 03/05/16   Enid Derry, PA-C    Allergies Patient has no known allergies.  No family history on file.  Social History Social History   Tobacco Use  . Smoking status: Never Smoker  . Smokeless tobacco: Never Used  Substance Use Topics  . Alcohol use: Yes    Comment: Occ on celebrations or holiday  . Drug use: Yes    Types: Marijuana    Review of Systems Constitutional: Myalgias, general malaise and fatigue. Eyes: No visual changes. ENT: Mild sore throat.  Nasal congestion and runny nose.  Ear fullness. Cardiovascular: some pressure associated with cough, occasional sharp pains. Respiratory: Cough, occasional shortness of breath associated with cough and/or exertion. Gastrointestinal: No abdominal pain.  Nausea, no vomiting or diarrhea. Genitourinary: Negative for dysuria. Musculoskeletal: Generalized body aches without any specific location.  Negative for neck pain.  Negative for back pain. Integumentary: Negative for rash. Neurological: Occasional mild generalized headache.  No focal weakness or numbness.  ____________________________________________   PHYSICAL EXAM:  VITAL SIGNS: ED Triage Vitals  Enc Vitals Group     BP 04/23/18 0024 129/89     Pulse Rate 04/23/18 0024 60     Resp 04/23/18 0024 16     Temp 04/23/18 0024 97.6 F (36.4 C)  Temp Source 04/23/18 0024 Oral     SpO2 04/23/18 0024 100 %     Weight 04/23/18 0001 76.7 kg (169 lb)     Height 04/23/18 0001 1.626 m (5\' 4" )     Head Circumference --      Peak Flow --      Pain Score 04/23/18 0000 8     Pain Loc --      Pain Edu? --      Excl. in GC? --     Constitutional: Alert and oriented. Well appearing and in no acute distress. Eyes:  Conjunctivae are normal.  Head: Atraumatic. Nose: +congestion/rhinnorhea. Mouth/Throat: Mucous membranes are moist. Neck: No stridor.  No meningeal signs.   Cardiovascular: Normal rate, regular rhythm. Good peripheral circulation. Grossly normal heart sounds. Respiratory: Normal respiratory effort.  No retractions. Lungs CTAB. Gastrointestinal: Soft and nontender. No distention.  Musculoskeletal: No lower extremity tenderness nor edema. No gross deformities of extremities. Neurologic:  Normal speech and language. No gross focal neurologic deficits are appreciated.  Skin:  Skin is warm, dry and intact. No rash noted. Psychiatric: Mood and affect are normal. Speech and behavior are normal.  ____________________________________________   LABS (all labs ordered are listed, but only abnormal results are displayed)  Labs Reviewed  COMPREHENSIVE METABOLIC PANEL - Abnormal; Notable for the following components:      Result Value   Calcium 8.6 (*)    Anion gap 4 (*)    All other components within normal limits  CBC WITH DIFFERENTIAL/PLATELET  TROPONIN I   ____________________________________________  EKG  ED ECG REPORT I, Loleta Roseory Chelcee Korpi, the attending physician, personally viewed and interpreted this ECG.  Date: 04/23/2018 EKG Time: 00:20 Rate: 53 Rhythm: Sinus bradycardia QRS Axis: normal Intervals: normal ST/T Wave abnormalities: normal Narrative Interpretation: no evidence of acute ischemia  ____________________________________________  RADIOLOGY I, Loleta Roseory Asencion Loveday, personally viewed and evaluated these images (plain radiographs) as part of my medical decision making, as well as reviewing the written report by the radiologist.  ED MD interpretation: No indication of acute abnormality specifically no pneumonia  Official radiology report(s): Dg Chest 2 View  Result Date: 04/23/2018 CLINICAL DATA:  Chest pain and shortness of Breath EXAM: CHEST - 2 VIEW COMPARISON:  03/05/2016  FINDINGS: The heart size and mediastinal contours are within normal limits. Both lungs are clear. The visualized skeletal structures are unremarkable. IMPRESSION: No active cardiopulmonary disease. Electronically Signed   By: Alcide CleverMark  Lukens M.D.   On: 04/23/2018 00:23    ____________________________________________   PROCEDURES  Critical Care performed: No   Procedure(s) performed:   Procedures   ____________________________________________   INITIAL IMPRESSION / ASSESSMENT AND PLAN / ED COURSE  As part of my medical decision making, I reviewed the following data within the electronic MEDICAL RECORD NUMBER Nursing notes reviewed and incorporated, Labs reviewed , EKG interpreted , Old chart reviewed, Radiograph reviewed  and Notes from prior ED visits    Differential diagnosis includes, but is not limited to, influenza, influenza-like illness or other viral syndrome, pneumonia, much less likely PE or ACS.  The patient's vital signs are all reassuring and she is afebrile, normotensive, and not tachycardic.  Comprehensive metabolic panel, troponin, and CBC are all within normal limits.  Physical exam is reassuring.  No evidence of acute bacterial infection.  Influenza is extremely prevalent right now and [facility] not test except in special populations at the extreme age ranges.  I explained all this to the patient  and I gave my usual and customary influenza/viral syndrome management recommendations and return precautions.  She understands and agrees with the plan.     ____________________________________________  FINAL CLINICAL IMPRESSION(S) / ED DIAGNOSES  Final diagnoses:  Influenza-like illness     MEDICATIONS GIVEN DURING THIS VISIT:  Medications - No data to display   ED Discharge Orders    None       Note:  This document was prepared using Dragon voice recognition software and may include unintentional dictation errors.   Loleta RoseForbach, Essie Gehret, MD 04/23/18  [phone]

## 2018-04-23 NOTE — Discharge Instructions (Signed)
You were diagnosed with an influenza-like viral illness.  You will feel ill for as much as a few weeks.  Please take any prescribed medications as instructed, and you may use over-the-counter Tylenol and/or ibuprofen as needed according to label instructions (unless you have an allergy to either or have been told by your doctor not to take them).  Please make sure to drink plenty of fluids and refer to the included information about rehydration. ° °Follow up with your physician as instructed above, and return to the Emergency Department (ED) if you are unable to tolerate fluids due to vomiting, have worsening trouble breathing, become extremely tired or difficult to awaken, or if you develop any other symptoms that concern you. ° °

## 2018-12-26 ENCOUNTER — Other Ambulatory Visit: Payer: Self-pay

## 2018-12-26 ENCOUNTER — Encounter: Payer: Self-pay | Admitting: Emergency Medicine

## 2018-12-26 ENCOUNTER — Emergency Department
Admission: EM | Admit: 2018-12-26 | Discharge: 2018-12-26 | Disposition: A | Discharge status: Home / Self Care | Payer: Self-pay | Attending: Emergency Medicine | Admitting: Emergency Medicine

## 2018-12-26 DIAGNOSIS — E860 Dehydration: Secondary | ICD-10-CM | POA: Insufficient documentation

## 2018-12-26 DIAGNOSIS — R197 Diarrhea, unspecified: Secondary | ICD-10-CM | POA: Insufficient documentation

## 2018-12-26 DIAGNOSIS — Z20828 Contact with and (suspected) exposure to other viral communicable diseases: Secondary | ICD-10-CM | POA: Insufficient documentation

## 2018-12-26 DIAGNOSIS — R112 Nausea with vomiting, unspecified: Secondary | ICD-10-CM | POA: Insufficient documentation

## 2018-12-26 DIAGNOSIS — Z79899 Other long term (current) drug therapy: Secondary | ICD-10-CM | POA: Insufficient documentation

## 2018-12-26 LAB — URINALYSIS, COMPLETE (UACMP) WITH MICROSCOPIC
Bilirubin Urine: NEGATIVE
Glucose, UA: NEGATIVE mg/dL
Hgb urine dipstick: NEGATIVE
Ketones, ur: 20 mg/dL — AB
Leukocytes,Ua: NEGATIVE
Nitrite: NEGATIVE
Protein, ur: 30 mg/dL — AB
Specific Gravity, Urine: 1.024 (ref 1.005–1.030)
pH: 5 (ref 5.0–8.0)

## 2018-12-26 LAB — COMPREHENSIVE METABOLIC PANEL
ALT: 33 U/L (ref 0–44)
AST: 33 U/L (ref 15–41)
Albumin: 5 g/dL (ref 3.5–5.0)
Alkaline Phosphatase: 69 U/L (ref 38–126)
Anion gap: 19 — ABNORMAL HIGH (ref 5–15)
BUN: 14 mg/dL (ref 6–20)
CO2: 16 mmol/L — ABNORMAL LOW (ref 22–32)
Calcium: 9.1 mg/dL (ref 8.9–10.3)
Chloride: 108 mmol/L (ref 98–111)
Creatinine, Ser: 0.89 mg/dL (ref 0.44–1.00)
GFR calc Af Amer: >60 mL/min (ref >60)
GFR calc non Af Amer: >60 mL/min (ref >60)
Glucose, Bld: 81 mg/dL (ref 70–99)
Potassium: 4.1 mmol/L (ref 3.5–5.1)
Sodium: 143 mmol/L (ref 135–145)
Total Bilirubin: 0.5 mg/dL (ref 0.3–1.2)
Total Protein: 8.5 g/dL — ABNORMAL HIGH (ref 6.5–8.1)

## 2018-12-26 LAB — URINE DRUG SCREEN, QUALITATIVE (ARMC ONLY)
Amphetamines, Ur Screen: NOT DETECTED
Barbiturates, Ur Screen: NOT DETECTED
Benzodiazepine, Ur Scrn: NOT DETECTED
Cannabinoid 50 Ng, Ur ~~LOC~~: POSITIVE — AB
Cocaine Metabolite,Ur ~~LOC~~: NOT DETECTED
MDMA (Ecstasy)Ur Screen: NOT DETECTED
Methadone Scn, Ur: NOT DETECTED
Opiate, Ur Screen: NOT DETECTED
Phencyclidine (PCP) Ur S: NOT DETECTED
Tricyclic, Ur Screen: NOT DETECTED

## 2018-12-26 LAB — CBC
HCT: 49.2 % — ABNORMAL HIGH (ref 36.0–46.0)
Hemoglobin: 16.1 g/dL — ABNORMAL HIGH (ref 12.0–15.0)
MCH: 32.3 pg (ref 26.0–34.0)
MCHC: 32.7 g/dL (ref 30.0–36.0)
MCV: 98.8 fL (ref 80.0–100.0)
Platelets: 230 10*3/uL (ref 150–400)
RBC: 4.98 MIL/uL (ref 3.87–5.11)
RDW: 13.5 % (ref 11.5–15.5)
WBC: 13.9 10*3/uL — ABNORMAL HIGH (ref 4.0–10.5)
nRBC: 0 % (ref 0.0–0.2)

## 2018-12-26 LAB — POC URINE PREG, ED: Preg Test, Ur: NEGATIVE

## 2018-12-26 LAB — LIPASE, BLOOD: Lipase: 16 U/L (ref 11–51)

## 2018-12-26 MED ORDER — DIPHENHYDRAMINE HCL 50 MG/ML IJ SOLN: 25.0000 mg | Freq: Once | INTRAMUSCULAR | Status: DC

## 2018-12-26 MED ORDER — PROMETHAZINE HCL 25 MG RE SUPP: 25.0000 mg | Freq: Three times a day (TID) | RECTAL | 0 refills | Status: DC | PRN

## 2018-12-26 MED ORDER — ONDANSETRON 4 MG PO TBDP: 4.0000 mg | ORAL_TABLET | Freq: Three times a day (TID) | ORAL | 0 refills | Status: DC | PRN

## 2018-12-26 MED ORDER — PROMETHAZINE HCL 25 MG/ML IJ SOLN
25.0000 mg | Freq: Once | INTRAMUSCULAR | Status: AC
  Administered 2018-12-26: 25 mg via INTRAVENOUS
  Filled 2018-12-26: qty 1

## 2018-12-26 MED ORDER — SODIUM CHLORIDE 0.9 % IV BOLUS
2000.0000 mL | Freq: Once | INTRAVENOUS | Status: AC
  Administered 2018-12-26: 2000 mL via INTRAVENOUS

## 2018-12-26 MED ORDER — SODIUM CHLORIDE 0.9% FLUSH
3.0000 mL | Freq: Once | INTRAVENOUS | Status: AC
  Administered 2018-12-26: 3 mL via INTRAVENOUS

## 2018-12-26 MED ORDER — OXYCODONE-ACETAMINOPHEN 5-325 MG PO TABS
1.0000 | ORAL_TABLET | ORAL | Status: DC | PRN
  Administered 2018-12-26: 1 via ORAL
  Filled 2018-12-26: qty 1

## 2018-12-26 MED ORDER — DICYCLOMINE HCL 20 MG PO TABS: 20.0000 mg | ORAL_TABLET | Freq: Three times a day (TID) | ORAL | 0 refills | Status: DC | PRN

## 2018-12-26 NOTE — ED Notes (Signed)
Successful PO challenge with water, MD aware

## 2018-12-26 NOTE — ED Provider Notes (Addendum)
[facility] Emergency Department Provider Note  ____________________________________________   None    (approximate)  I have reviewed the triage vital signs and the nursing notes.   HISTORY  Chief Complaint Emesis    HPI Pamela Kent is a 28 y.o. female  With h/o PCOS, PMDD, PTSD, here with nausea, vomiting. Pt states she was in her usual state of health last night, went out with friends. She reports she was around multiple people and had a small amount of alcohol. She reports that this AM, she began vomiting and had diarrhea. She felt nauseous as well. She tried to go to work but was unable to do so 2/2 her nausea, vomiting. She has since had some mild lightheadedness and "occasional spots" in her vision along with a mild, generalized HA that was not acute in onset. No fevers. She has some mild aching epigastric pain. No diarrhea. No known fevers. 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 No alleviating or aggravating factors other than worsening nausea w/ attempts to drink. No urinary sx, vaginal bleeding or discharge.        Past Medical History:  Diagnosis Date  . Depression   . PCOS (polycystic ovarian syndrome)   . PMDD (premenstrual dysphoric disorder)   . PTSD (post-traumatic stress disorder)     Patient Active Problem List   Diagnosis Date Noted  . Severe recurrent major depression without psychotic features (HCC) 11/24/2015  . PTSD (post-traumatic stress disorder) 11/24/2015    Past Surgical History:  Procedure Laterality Date  . CYSTECTOMY      Prior to Admission medications   Medication Sig Start Date End Date Taking? Authorizing Provider  cephALEXin (KEFLEX) 500 MG capsule Take 1 capsule (500 mg total) by mouth 4 (four) times daily. 09/15/17   Zadie Rhine, MD  dicyclomine (BENTYL) 20 MG tablet Take 1 tablet (20 mg total) by mouth 3 (three) times daily as needed for spasms (abdominal cramps).  12/26/18 12/26/19  Shaune Pollack, MD  ondansetron (ZOFRAN ODT) 4 MG disintegrating tablet Take 1 tablet (4 mg total) by mouth every 8 (eight) hours as needed for nausea or vomiting. 12/26/18   Shaune Pollack, MD  oxyCODONE-acetaminophen (ROXICET) 5-325 MG tablet Take 1 tablet by mouth every 6 (six) hours as needed for severe pain. 03/05/16   Enid Derry, PA-C  promethazine (PHENERGAN) 25 MG suppository Place 1 suppository (25 mg total) rectally every 8 (eight) hours as needed for refractory nausea / vomiting. 12/26/18 12/26/19  Shaune Pollack, MD    Allergies Patient has no known allergies.  No family history on file.  Social History Social History   Tobacco Use  . Smoking status: Never Smoker  . Smokeless tobacco: Never Used  Substance Use Topics  . Alcohol use: Yes    Comment: Occ on celebrations or holiday  . Drug use: Yes    Types: Marijuana    Review of Systems  Review of Systems  Constitutional: Positive for fatigue. Negative for fever.  HENT: Negative for congestion and sore throat.   Eyes: Negative for visual disturbance.  Respiratory: Negative for cough and shortness of breath.   Cardiovascular: Negative for chest pain.  Gastrointestinal: Positive for nausea and vomiting. Negative for abdominal pain and diarrhea.  Genitourinary: Negative for flank pain.  Musculoskeletal: Negative for back pain and neck pain.  Skin: Negative for rash and wound.  Neurological: Positive for light-headedness and headaches. Negative for weakness.  All other systems reviewed  and are negative.    ____________________________________________  PHYSICAL EXAM:      VITAL SIGNS: ED Triage Vitals  Enc Vitals Group     BP 12/26/18 1044 125/72     Pulse Rate 12/26/18 1042 80     Resp 12/26/18 1042 16     Temp 12/26/18 1042 98.1 F (36.7 C)     Temp Source 12/26/18 1042 Oral     SpO2 12/26/18 1042 97 %     Weight 12/26/18 1045 175 lb (79.4 kg)     Height 12/26/18 1045 5\' 4"   (1.626 m)     Head Circumference --      Peak Flow --      Pain Score 12/26/18 1045 8     Pain Loc --      Pain Edu? --      Excl. in GC? --      Physical Exam Vitals signs and nursing note reviewed.  Constitutional:      General: She is not in acute distress.    Appearance: She is well-developed.  HENT:     Head: Normocephalic and atraumatic.     Mouth/Throat:     Mouth: Mucous membranes are dry.  Eyes:     Conjunctiva/sclera: Conjunctivae normal.  Neck:     Musculoskeletal: Neck supple.     Comments: No neck stiffness or rigidity Cardiovascular:     Rate and Rhythm: Normal rate and regular rhythm.     Heart sounds: Normal heart sounds. No murmur. No friction rub.  Pulmonary:     Effort: Pulmonary effort is normal. No respiratory distress.     Breath sounds: Normal breath sounds. No wheezing or rales.  Abdominal:     General: There is no distension.     Palpations: Abdomen is soft.     Tenderness: There is abdominal tenderness (minimal, epigastric).  Skin:    General: Skin is warm.     Capillary Refill: Capillary refill takes less than 2 seconds.  Neurological:     General: No focal deficit present.     Mental Status: She is alert and oriented to person, place, and time.     Motor: No abnormal muscle tone.       ____________________________________________   LABS (all labs ordered are listed, but only abnormal results are displayed)  Labs Reviewed  COMPREHENSIVE METABOLIC PANEL - Abnormal; Notable for the following components:      Result Value   CO2 16 (*)    Total Protein 8.5 (*)    Anion gap 19 (*)    All other components within normal limits  CBC - Abnormal; Notable for the following components:   WBC 13.9 (*)    Hemoglobin 16.1 (*)    HCT 49.2 (*)    All other components within normal limits  URINALYSIS, COMPLETE (UACMP) WITH MICROSCOPIC - Abnormal; Notable for the following components:   Color, Urine YELLOW (*)    APPearance CLOUDY (*)     Ketones, ur 20 (*)    Protein, ur 30 (*)    Bacteria, UA RARE (*)    All other components within normal limits  URINE DRUG SCREEN, QUALITATIVE ([facility] ONLY) - Abnormal; Notable for the following components:   Cannabinoid 50 Ng, Ur [place] POSITIVE (*)    All other components within normal limits  NOVEL CORONAVIRUS, NAA (HOSP ORDER, SEND-OUT TO REF LAB; TAT 18-24 HRS)  LIPASE, BLOOD  POC URINE PREG, ED    ____________________________________________  EKG: None ________________________________________  RADIOLOGY All imaging, including plain films, CT scans, and ultrasounds, independently reviewed by me, and interpretations confirmed via formal radiology reads.  ED MD interpretation:   None  Official radiology report(s): No results found.  ____________________________________________  PROCEDURES   Procedure(s) performed (including Critical Care):  Procedures  ____________________________________________  INITIAL IMPRESSION / MDM / [place] / ED COURSE  As part of my medical decision making, I reviewed the following data within the electronic MEDICAL RECORD NUMBER Notes from prior ED visits and [place] Controlled Substance Database      *IRYS NIGH was evaluated in Emergency Department on 12/26/2018 for the symptoms described in the history of present illness. She was evaluated in the context of the global COVID-19 pandemic, which necessitated consideration that the patient might be at risk for infection with the SARS-CoV-2 virus that causes COVID-19. Institutional protocols and algorithms that pertain to the evaluation of patients at risk for COVID-19 are in a state of rapid change based on information released by regulatory bodies including the CDC and federal and state organizations. These policies and algorithms were followed during the patient's care in the ED.  Some ED evaluations and interventions may be delayed as a result of limited staffing during the pandemic.*    Clinical Course as of Dec 25 1541  Thu Dec 26, 2018  1245 27 yo F here with n/v/d, lightheadedness after going out drinking last night. Suspect alcoholic gastritis, vs viral Gi illness, vs food-borne illness. No focal TTP to suggest appendicitis or cholecystitis. Labs reviewed - decreased CO2 and leukocytosis likely 2/2 hemoconcentration and dehydration from vomiting. No fever and pt is non-toxic on exam, doubt intra-abd pathology. Will start fluids, check urine, and reassess. If improved, d/c home.   [CI]  1321 Urine pregnancy negative.  Will reassess after meds and fluids.  Likely discharge.   [CI]  1340 UA c/w dehydration but no pyuria, no urinary symptoms to suggest UTI. UDS pending. Plan to PO challenge and re-assess.   [CI]    Clinical Course User Index [CI] Duffy Bruce, MD    Medical Decision Making:  As above. Tolerating PO now w/o difficulty. Abdomen remains benign. Low concern for sepsis, appendicitis, cholecystitis. D/c with outpt follow-up.  ____________________________________________  FINAL CLINICAL IMPRESSION(S) / ED DIAGNOSES  Final diagnoses:  Dehydration  Nausea vomiting and diarrhea     MEDICATIONS GIVEN DURING THIS VISIT:  Medications  oxyCODONE-acetaminophen (PERCOCET/ROXICET) 5-325 MG per tablet 1 tablet (1 tablet Oral Given 12/26/18 1129)  sodium chloride flush (NS) 0.9 % injection 3 mL (3 mLs Intravenous Given 12/26/18 1541)  sodium chloride 0.9 % bolus 2,000 mL (0 mLs Intravenous Stopped 12/26/18 1541)  promethazine (PHENERGAN) injection 25 mg (25 mg Intravenous Given 12/26/18 1309)     ED Discharge Orders         Ordered    ondansetron (ZOFRAN ODT) 4 MG disintegrating tablet  Every 8 hours PRN     12/26/18 1334    dicyclomine (BENTYL) 20 MG tablet  3 times daily PRN     12/26/18 1334    promethazine (PHENERGAN) 25 MG suppository  Every 8 hours PRN     12/26/18 1334           Note:  This document was prepared using Dragon voice  recognition software and may include unintentional dictation errors.   Duffy Bruce, MD 12/26/18 [facility]    Duffy Bruce, MD 12/26/18 [phone]

## 2018-12-26 NOTE — ED Notes (Signed)
ED Provider at bedside. 

## 2018-12-26 NOTE — Discharge Instructions (Signed)
Take the medications as prescribed  Drink at least 6-8 glasses of water  Stick to a non-spicy, bland diet for the next 2-3 days  Follow-up your Coronavirus test, which has been sent today. The results should be available in 24-48 hours. Do not return to work until this is negative.

## 2018-12-26 NOTE — ED Triage Notes (Addendum)
Says she has been having a headache for days., but today she has vomitied x 3 and diarrhea also.  She was sent home from work.  Still has the headache.  Also says she is seeing spots and felt lightheaded.  Did drink etoh last night.  She took a zofran they gave her

## 2018-12-26 NOTE — ED Notes (Signed)
Py admits to "partying with consumption of ETOH and weed last night. Today awoke feeling "hung over" . Pt awake and alert, pt feels continuous nausea, but feels hungry

## 2018-12-27 LAB — NOVEL CORONAVIRUS, NAA (HOSP ORDER, SEND-OUT TO REF LAB; TAT 18-24 HRS): SARS-CoV-2, NAA: NOT DETECTED

## 2019-04-15 ENCOUNTER — Other Ambulatory Visit: Payer: Self-pay

## 2019-04-15 ENCOUNTER — Ambulatory Visit (LOCAL_COMMUNITY_HEALTH_CENTER): Payer: Self-pay

## 2019-04-15 DIAGNOSIS — Z23 Encounter for immunization: Secondary | ICD-10-CM

## 2019-08-18 DIAGNOSIS — A549 Gonococcal infection, unspecified: Secondary | ICD-10-CM | POA: Diagnosis not present

## 2019-08-18 DIAGNOSIS — Z8349 Family history of other endocrine, nutritional and metabolic diseases: Secondary | ICD-10-CM | POA: Diagnosis not present

## 2019-08-18 DIAGNOSIS — J392 Other diseases of pharynx: Secondary | ICD-10-CM | POA: Diagnosis not present

## 2019-08-18 DIAGNOSIS — Z131 Encounter for screening for diabetes mellitus: Secondary | ICD-10-CM | POA: Diagnosis not present

## 2019-08-18 DIAGNOSIS — Z13220 Encounter for screening for lipoid disorders: Secondary | ICD-10-CM | POA: Diagnosis not present

## 2019-08-18 DIAGNOSIS — Z113 Encounter for screening for infections with a predominantly sexual mode of transmission: Secondary | ICD-10-CM | POA: Diagnosis not present

## 2019-09-21 IMAGING — CR DG CHEST 2V
1 series · 2 of 2 positions shown · non-contrast
Comparison: 03/05/2016

CLINICAL DATA: Chest pain and shortness of Breath

EXAM:
CHEST - 2 VIEW

[Series 1: dg chest 2 view · 0.14mm/px · 2 of 2 slices shown]
[im 1/2]
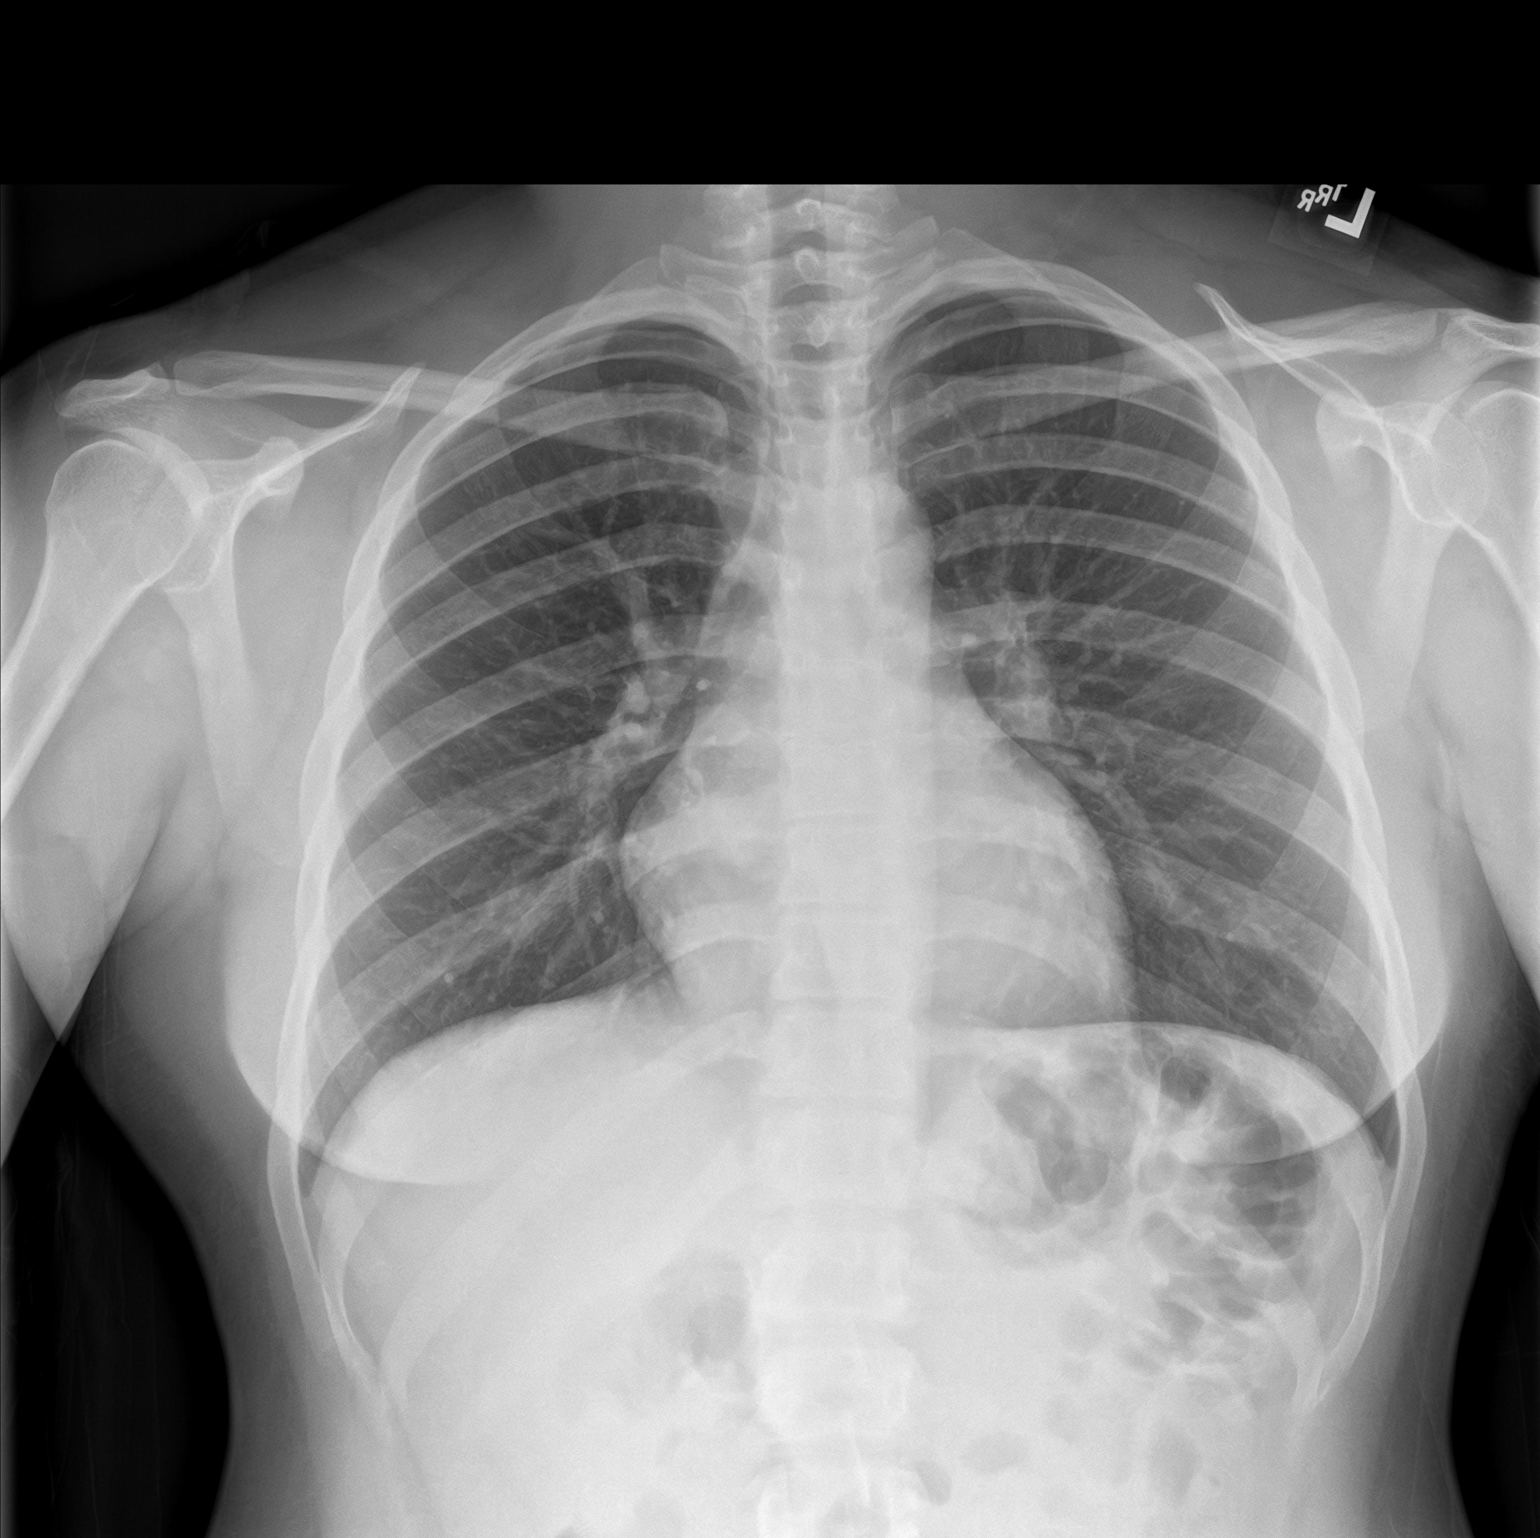
[im 2/2]
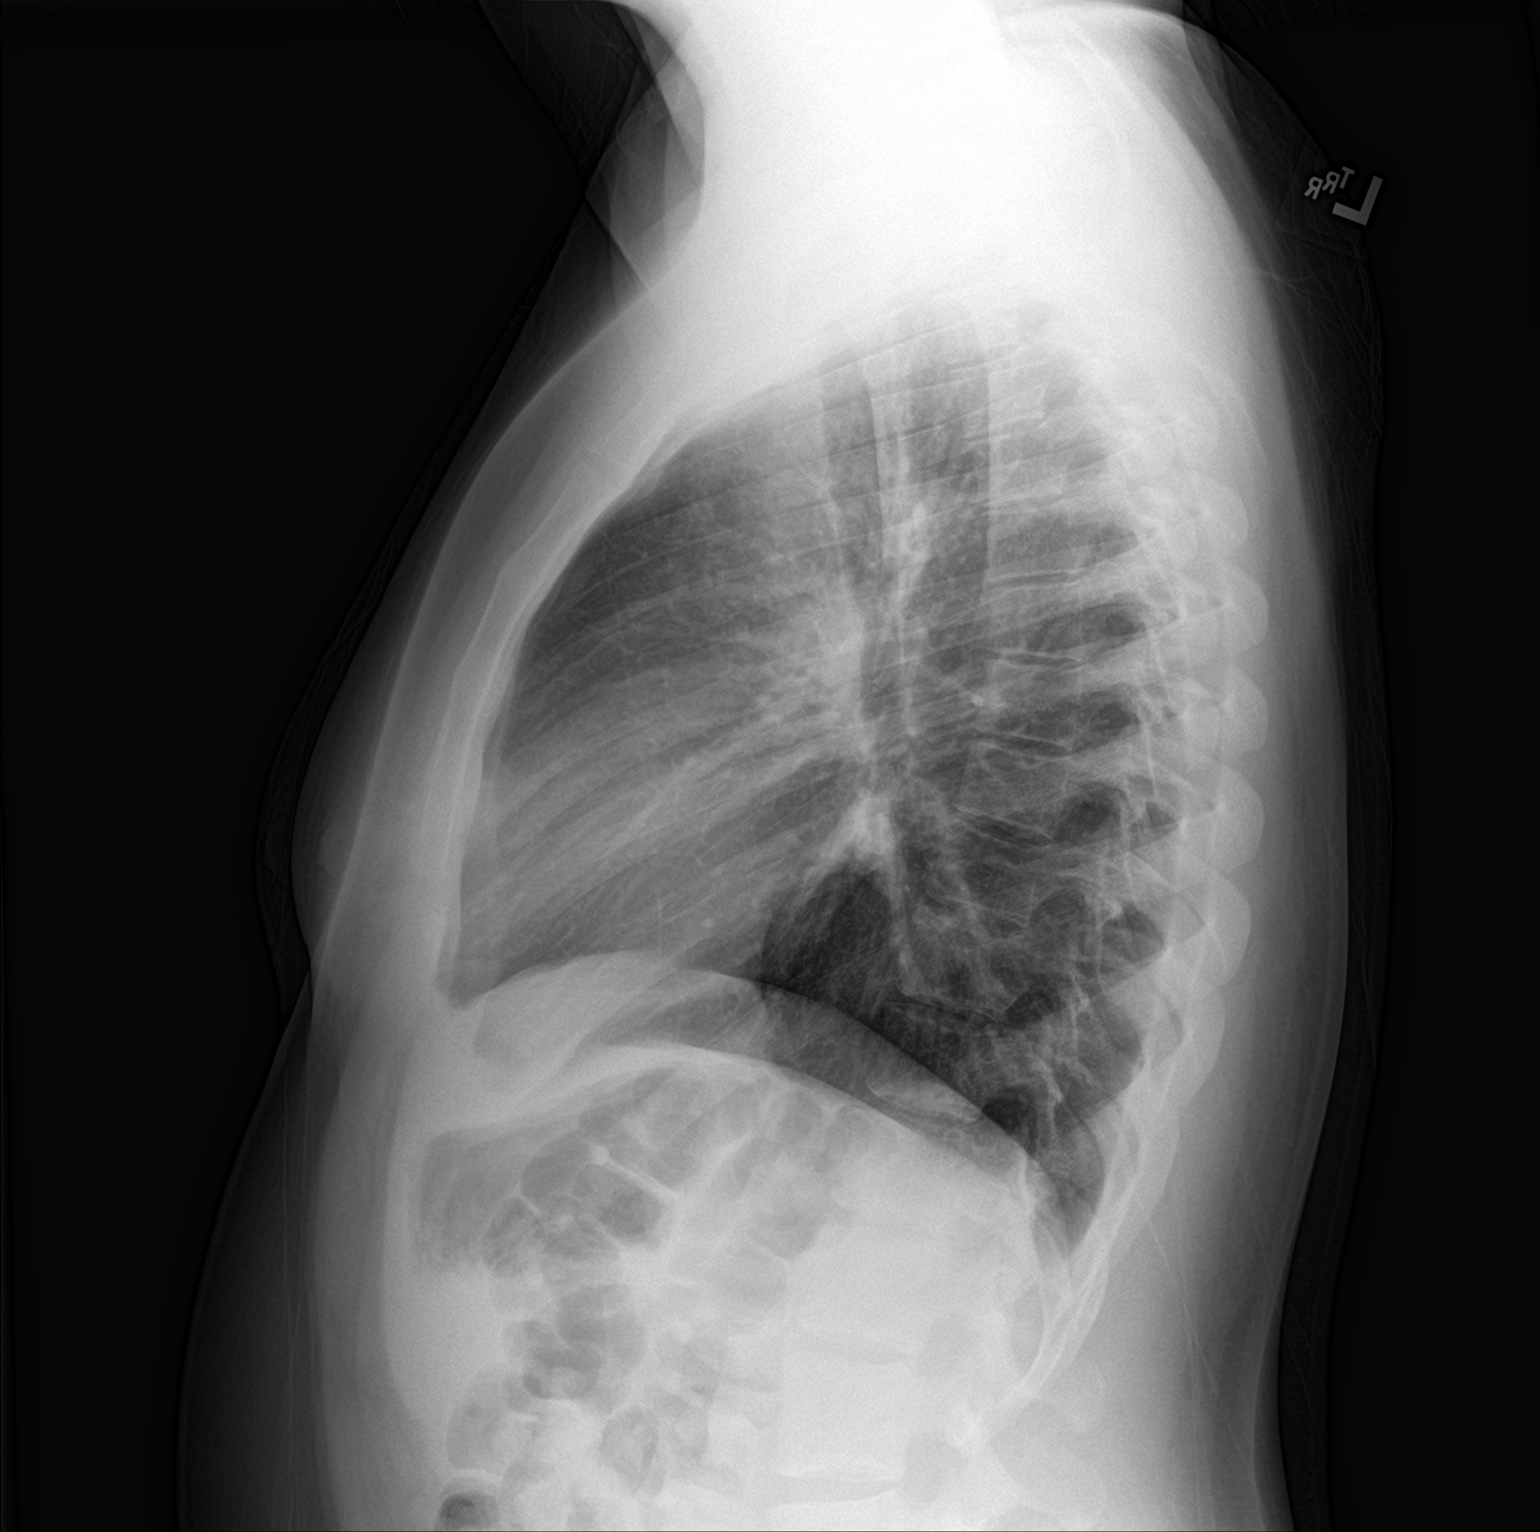

[2 of 2 positions shown; findings below may reference images not displayed]

FINDINGS: The heart size and mediastinal contours are within normal limits.
Both lungs are clear. The visualized skeletal structures are
unremarkable.
IMPRESSION: No active cardiopulmonary disease.

## 2019-11-24 DIAGNOSIS — E559 Vitamin D deficiency, unspecified: Secondary | ICD-10-CM | POA: Diagnosis not present

## 2019-11-24 DIAGNOSIS — E782 Mixed hyperlipidemia: Secondary | ICD-10-CM | POA: Diagnosis not present

## 2019-11-24 DIAGNOSIS — N951 Menopausal and female climacteric states: Secondary | ICD-10-CM | POA: Diagnosis not present

## 2019-11-24 DIAGNOSIS — R635 Abnormal weight gain: Secondary | ICD-10-CM | POA: Diagnosis not present

## 2019-11-24 DIAGNOSIS — Z6834 Body mass index (BMI) 34.0-34.9, adult: Secondary | ICD-10-CM | POA: Diagnosis not present

## 2019-12-01 DIAGNOSIS — R61 Generalized hyperhidrosis: Secondary | ICD-10-CM | POA: Diagnosis not present

## 2019-12-01 DIAGNOSIS — Z1331 Encounter for screening for depression: Secondary | ICD-10-CM | POA: Diagnosis not present

## 2019-12-01 DIAGNOSIS — E282 Polycystic ovarian syndrome: Secondary | ICD-10-CM | POA: Diagnosis not present

## 2019-12-01 DIAGNOSIS — N951 Menopausal and female climacteric states: Secondary | ICD-10-CM | POA: Diagnosis not present

## 2019-12-01 DIAGNOSIS — E782 Mixed hyperlipidemia: Secondary | ICD-10-CM | POA: Diagnosis not present

## 2019-12-01 DIAGNOSIS — R635 Abnormal weight gain: Secondary | ICD-10-CM | POA: Diagnosis not present

## 2019-12-01 DIAGNOSIS — E559 Vitamin D deficiency, unspecified: Secondary | ICD-10-CM | POA: Diagnosis not present

## 2019-12-01 DIAGNOSIS — Z6833 Body mass index (BMI) 33.0-33.9, adult: Secondary | ICD-10-CM | POA: Diagnosis not present

## 2019-12-01 DIAGNOSIS — Z1339 Encounter for screening examination for other mental health and behavioral disorders: Secondary | ICD-10-CM | POA: Diagnosis not present

## 2019-12-08 DIAGNOSIS — Z6833 Body mass index (BMI) 33.0-33.9, adult: Secondary | ICD-10-CM | POA: Diagnosis not present

## 2019-12-08 DIAGNOSIS — E559 Vitamin D deficiency, unspecified: Secondary | ICD-10-CM | POA: Diagnosis not present

## 2019-12-15 DIAGNOSIS — E559 Vitamin D deficiency, unspecified: Secondary | ICD-10-CM | POA: Diagnosis not present

## 2020-04-09 ENCOUNTER — Other Ambulatory Visit: Payer: Self-pay

## 2020-04-09 ENCOUNTER — Other Ambulatory Visit: Payer: Self-pay | Admitting: Obstetrics and Gynecology

## 2020-04-09 ENCOUNTER — Emergency Department
Admission: EM | Admit: 2020-04-09 | Discharge: 2020-04-10 | Disposition: A | Discharge status: Home / Self Care | Payer: 59 | Attending: Emergency Medicine | Admitting: Emergency Medicine

## 2020-04-09 ENCOUNTER — Encounter: Payer: Self-pay | Admitting: Emergency Medicine

## 2020-04-09 DIAGNOSIS — R109 Unspecified abdominal pain: Secondary | ICD-10-CM | POA: Diagnosis not present

## 2020-04-09 DIAGNOSIS — Z20822 Contact with and (suspected) exposure to covid-19: Secondary | ICD-10-CM | POA: Insufficient documentation

## 2020-04-09 DIAGNOSIS — R112 Nausea with vomiting, unspecified: Secondary | ICD-10-CM | POA: Diagnosis not present

## 2020-04-09 DIAGNOSIS — R197 Diarrhea, unspecified: Secondary | ICD-10-CM | POA: Insufficient documentation

## 2020-04-09 LAB — COMPREHENSIVE METABOLIC PANEL
ALT: 39 U/L (ref 0–44)
AST: 36 U/L (ref 15–41)
Albumin: 4.7 g/dL (ref 3.5–5.0)
Alkaline Phosphatase: 61 U/L (ref 38–126)
Anion gap: 15 (ref 5–15)
BUN: 10 mg/dL (ref 6–20)
CO2: 24 mmol/L (ref 22–32)
Calcium: 9.6 mg/dL (ref 8.9–10.3)
Chloride: 102 mmol/L (ref 98–111)
Creatinine, Ser: 1 mg/dL (ref 0.44–1.00)
GFR, Estimated: >60 mL/min (ref >60)
Glucose, Bld: 110 mg/dL — ABNORMAL HIGH (ref 70–99)
Potassium: 3.9 mmol/L (ref 3.5–5.1)
Sodium: 141 mmol/L (ref 135–145)
Total Bilirubin: 0.9 mg/dL (ref 0.3–1.2)
Total Protein: 8.7 g/dL — ABNORMAL HIGH (ref 6.5–8.1)

## 2020-04-09 LAB — LIPASE, BLOOD: Lipase: 23 U/L (ref 11–51)

## 2020-04-09 LAB — CBC
HCT: 48.3 % — ABNORMAL HIGH (ref 36.0–46.0)
Hemoglobin: 16.8 g/dL — ABNORMAL HIGH (ref 12.0–15.0)
MCH: 33.6 pg (ref 26.0–34.0)
MCHC: 34.8 g/dL (ref 30.0–36.0)
MCV: 96.6 fL (ref 80.0–100.0)
Platelets: 218 10*3/uL (ref 150–400)
RBC: 5 MIL/uL (ref 3.87–5.11)
RDW: 13.8 % (ref 11.5–15.5)
WBC: 11.9 10*3/uL — ABNORMAL HIGH (ref 4.0–10.5)
nRBC: 0 % (ref 0.0–0.2)

## 2020-04-09 LAB — CBG MONITORING, ED: Glucose-Capillary: 94 mg/dL (ref 70–99)

## 2020-04-09 MED ORDER — ONDANSETRON HCL 4 MG PO TABS: 4.0000 mg | ORAL_TABLET | Freq: Three times a day (TID) | ORAL | 0 refills | Status: DC | PRN

## 2020-04-09 MED ORDER — ONDANSETRON 4 MG PO TBDP
4.0000 mg | ORAL_TABLET | Freq: Once | ORAL | Status: AC | PRN
  Administered 2020-04-09: 4 mg via ORAL
  Filled 2020-04-09: qty 1

## 2020-04-09 MED ORDER — SODIUM CHLORIDE 0.9 % IV BOLUS
1000.0000 mL | Freq: Once | INTRAVENOUS | Status: AC
  Administered 2020-04-09: 1000 mL via INTRAVENOUS

## 2020-04-09 MED ORDER — ONDANSETRON HCL 4 MG/2ML IJ SOLN
4.0000 mg | Freq: Once | INTRAMUSCULAR | Status: AC
  Administered 2020-04-09: 4 mg via INTRAVENOUS
  Filled 2020-04-09: qty 2

## 2020-04-09 NOTE — ED Provider Notes (Signed)
[facility] Emergency Department Provider Note   ____________________________________________   I have reviewed the triage vital signs and the nursing notes.   HISTORY  Chief Complaint Nausea, vomiting and diarrhea  History limited by: Not Limited   HPI Pamela Kent is a 30 y.o. female who presents to the emergency department today because of concerns for nausea vomiting and diarrhea.  Patient states that the symptoms started this afternoon.  She is now had multiple episodes of vomiting and diarrhea.  This has been accompanied by some abdominal discomfort which she thinks is secondary to the amount of vomiting she has had.  Patient states that she has not had good oral intake for the past couple of days.  It is not unusual for her apparently to go a day or 2 without food.  After having not gone with food for 2 days she then went out and drank alcohol last night.  She does wonder if this contributed to her symptoms today.  The patient denies any fevers.  Denies any previous abdominal surgeries.  Records reviewed. Per medical record review patient has a history of PCOS  Past Medical History:  Diagnosis Date  . Depression   . PCOS (polycystic ovarian syndrome)   . PMDD (premenstrual dysphoric disorder)   . PTSD (post-traumatic stress disorder)     Patient Active Problem List   Diagnosis Date Noted  . Severe recurrent major depression without psychotic features (HCC) 11/24/2015  . PTSD (post-traumatic stress disorder) 11/24/2015    Past Surgical History:  Procedure Laterality Date  . CYSTECTOMY    . WISDOM TOOTH EXTRACTION      Prior to Admission medications   Medication Sig Start Date End Date Taking? Authorizing Provider  cephALEXin (KEFLEX) 500 MG capsule Take 1 capsule (500 mg total) by mouth 4 (four) times daily. 09/15/17   Zadie Rhine, MD  dicyclomine (BENTYL) 20 MG tablet Take 1 tablet (20 mg total) by mouth 3 (three) times daily as needed  for spasms (abdominal cramps). 12/26/18 12/26/19  Shaune Pollack, MD  ondansetron (ZOFRAN ODT) 4 MG disintegrating tablet Take 1 tablet (4 mg total) by mouth every 8 (eight) hours as needed for nausea or vomiting. 12/26/18   Shaune Pollack, MD  oxyCODONE-acetaminophen (ROXICET) 5-325 MG tablet Take 1 tablet by mouth every 6 (six) hours as needed for severe pain. 03/05/16   Enid Derry, PA-C  promethazine (PHENERGAN) 25 MG suppository Place 1 suppository (25 mg total) rectally every 8 (eight) hours as needed for refractory nausea / vomiting. 12/26/18 12/26/19  Shaune Pollack, MD    Allergies Patient has no known allergies.  No family history on file.  Social History Social History   Tobacco Use  . Smoking status: Never Smoker  . Smokeless tobacco: Never Used  Substance Use Topics  . Alcohol use: Yes    Comment: Occ on celebrations or holiday  . Drug use: Yes    Types: Marijuana    Review of Systems Constitutional: No fever/chills Eyes: No visual changes. ENT: No sore throat. Cardiovascular: Denies chest pain. Respiratory: Denies shortness of breath. Gastrointestinal: Positive for abdominal discomfort, nausea, vomiting and diarrhea.  Genitourinary: Negative for dysuria. Musculoskeletal: Negative for back pain. Skin: Negative for rash. Neurological: Negative for headaches, focal weakness or numbness.  ____________________________________________   PHYSICAL EXAM:  VITAL SIGNS: ED Triage Vitals  Enc Vitals Group     BP 04/09/20 1939 (!) 123/92     Pulse Rate 04/09/20 1939 62  Resp 04/09/20 1939 20     Temp 04/09/20 1939 (!) 97.5 F (36.4 C)     Temp Source 04/09/20 1939 Oral     SpO2 04/09/20 1939 96 %     Weight 04/09/20 1941 185 lb (83.9 kg)     Height 04/09/20 1941 5\' 4"  (1.626 m)     Head Circumference --      Peak Flow --      Pain Score 04/09/20 1941 4   Constitutional: Alert and oriented.  Eyes: Conjunctivae are normal.  ENT      Head:  Normocephalic and atraumatic.      Nose: No congestion/rhinnorhea.      Mouth/Throat: Mucous membranes are moist.      Neck: No stridor. Hematological/Lymphatic/Immunilogical: No cervical lymphadenopathy. Cardiovascular: Normal rate, regular rhythm.  No murmurs, rubs, or gallops.  Respiratory: Normal respiratory effort without tachypnea nor retractions. Breath sounds are clear and equal bilaterally. No wheezes/rales/rhonchi. Gastrointestinal: Soft and non tender. No rebound. No guarding.  Genitourinary: Deferred Musculoskeletal: Normal range of motion in all extremities. No lower extremity edema. Neurologic:  Normal speech and language. No gross focal neurologic deficits are appreciated.  Skin:  Skin is warm, dry and intact. No rash noted. Psychiatric: Mood and affect are normal. Speech and behavior are normal. Patient exhibits appropriate insight and judgment.  ____________________________________________    LABS (pertinent positives/negatives)  CBC wbc 11.9, hgb 16.8, plt 218 CMP wnl except glu 110, t pro 8.7 Lipase 23  ____________________________________________   EKG  None  ____________________________________________    RADIOLOGY  None  ____________________________________________   PROCEDURES  Procedures  ____________________________________________   INITIAL IMPRESSION / ASSESSMENT AND PLAN / ED COURSE  Pertinent labs & imaging results that were available during my care of the patient were reviewed by me and considered in my medical decision making (see chart for details).   Patient presented to the emergency department today because of concerns for nausea vomiting diarrhea.  Patient's abdomen without any tenderness on palpation.  No significant leukocytosis.  No elevation of lipase. Patient was given IV fluids and zofran. Did start to feel better. Will plan on PO challenge and if she passes will plan on discharge home with antiemetic.    ____________________________________________   FINAL CLINICAL IMPRESSION(S) / ED DIAGNOSES  Final diagnoses:  Nausea vomiting and diarrhea     Note: This dictation was prepared with Dragon dictation. Any transcriptional errors that result from this process are unintentional     04/11/20, MD 04/09/20 2344

## 2020-04-09 NOTE — Discharge Instructions (Signed)
Please seek medical attention for any high fevers, chest pain, shortness of breath, change in behavior, persistent vomiting, bloody stool or any other new or concerning symptoms.  

## 2020-04-09 NOTE — ED Triage Notes (Signed)
Pt reports she developed nausea, vomiting and diarrhea this afternoon, reports has had 4 episodes of diarrhea, and has vomited 6 times. Pt reports mild lower abdominal pain, reports "like needles" pt talks in complete sentences no distress noted.

## 2020-04-09 NOTE — ED Notes (Signed)
Patient states she vomited the Zofran she was given for nausea and vomiting.

## 2020-04-09 NOTE — ED Notes (Signed)
Pt declines EKG

## 2020-04-10 DIAGNOSIS — R112 Nausea with vomiting, unspecified: Secondary | ICD-10-CM | POA: Diagnosis not present

## 2020-04-10 DIAGNOSIS — Z20822 Contact with and (suspected) exposure to covid-19: Secondary | ICD-10-CM | POA: Diagnosis not present

## 2020-04-10 DIAGNOSIS — R109 Unspecified abdominal pain: Secondary | ICD-10-CM | POA: Diagnosis not present

## 2020-04-10 DIAGNOSIS — R197 Diarrhea, unspecified: Secondary | ICD-10-CM | POA: Diagnosis not present

## 2020-04-10 LAB — URINALYSIS, COMPLETE (UACMP) WITH MICROSCOPIC
Bacteria, UA: NONE SEEN
Bilirubin Urine: NEGATIVE
Glucose, UA: NEGATIVE mg/dL
Hgb urine dipstick: NEGATIVE
Ketones, ur: 80 mg/dL — AB
Leukocytes,Ua: NEGATIVE
Nitrite: NEGATIVE
Protein, ur: 30 mg/dL — AB
Specific Gravity, Urine: 1.029 (ref 1.005–1.030)
pH: 7 (ref 5.0–8.0)

## 2020-04-10 LAB — SARS CORONAVIRUS 2 (TAT 6-24 HRS): SARS Coronavirus 2: NEGATIVE

## 2020-04-10 LAB — POC URINE PREG, ED: Preg Test, Ur: NEGATIVE

## 2020-04-10 NOTE — ED Notes (Signed)
Patient given ginger ale for PO challenge. Patient able to tolerate ginger ale without c/o N/V. Patient states she will use the bathroom after the fluid bolus completes.

## 2020-04-10 NOTE — ED Notes (Signed)
Patient gave this RN verbal consent for discharge, sign pad unavailable.

## 2020-05-15 DIAGNOSIS — M25461 Effusion, right knee: Secondary | ICD-10-CM | POA: Diagnosis not present

## 2020-05-15 DIAGNOSIS — M25361 Other instability, right knee: Secondary | ICD-10-CM | POA: Diagnosis not present

## 2020-06-16 DIAGNOSIS — M25361 Other instability, right knee: Secondary | ICD-10-CM | POA: Diagnosis not present

## 2020-06-23 DIAGNOSIS — M25561 Pain in right knee: Secondary | ICD-10-CM | POA: Diagnosis not present

## 2020-06-24 DIAGNOSIS — M2241 Chondromalacia patellae, right knee: Secondary | ICD-10-CM | POA: Diagnosis not present

## 2020-07-19 DIAGNOSIS — M2241 Chondromalacia patellae, right knee: Secondary | ICD-10-CM | POA: Diagnosis not present

## 2020-08-03 DIAGNOSIS — M2241 Chondromalacia patellae, right knee: Secondary | ICD-10-CM | POA: Diagnosis not present

## 2020-08-12 DIAGNOSIS — M2241 Chondromalacia patellae, right knee: Secondary | ICD-10-CM | POA: Diagnosis not present

## 2020-08-31 DIAGNOSIS — F419 Anxiety disorder, unspecified: Secondary | ICD-10-CM | POA: Diagnosis not present

## 2020-09-04 DIAGNOSIS — R112 Nausea with vomiting, unspecified: Secondary | ICD-10-CM | POA: Diagnosis not present

## 2020-09-04 DIAGNOSIS — Z20822 Contact with and (suspected) exposure to covid-19: Secondary | ICD-10-CM | POA: Diagnosis not present

## 2020-09-04 DIAGNOSIS — R1084 Generalized abdominal pain: Secondary | ICD-10-CM | POA: Diagnosis not present

## 2020-09-04 DIAGNOSIS — A059 Bacterial foodborne intoxication, unspecified: Secondary | ICD-10-CM | POA: Diagnosis not present

## 2020-09-06 DIAGNOSIS — Z113 Encounter for screening for infections with a predominantly sexual mode of transmission: Secondary | ICD-10-CM | POA: Diagnosis not present

## 2020-09-08 DIAGNOSIS — O212 Late vomiting of pregnancy: Secondary | ICD-10-CM | POA: Diagnosis not present

## 2020-09-08 DIAGNOSIS — O26852 Spotting complicating pregnancy, second trimester: Secondary | ICD-10-CM | POA: Diagnosis not present

## 2020-09-08 DIAGNOSIS — O219 Vomiting of pregnancy, unspecified: Secondary | ICD-10-CM | POA: Diagnosis not present

## 2020-09-08 DIAGNOSIS — R519 Headache, unspecified: Secondary | ICD-10-CM | POA: Diagnosis not present

## 2020-09-08 DIAGNOSIS — R10816 Epigastric abdominal tenderness: Secondary | ICD-10-CM | POA: Diagnosis not present

## 2020-09-08 DIAGNOSIS — R1013 Epigastric pain: Secondary | ICD-10-CM | POA: Diagnosis not present

## 2020-09-08 DIAGNOSIS — Z3A22 22 weeks gestation of pregnancy: Secondary | ICD-10-CM | POA: Diagnosis not present

## 2020-09-08 DIAGNOSIS — O99891 Other specified diseases and conditions complicating pregnancy: Secondary | ICD-10-CM | POA: Diagnosis not present

## 2020-09-08 DIAGNOSIS — O209 Hemorrhage in early pregnancy, unspecified: Secondary | ICD-10-CM | POA: Diagnosis not present

## 2020-09-08 DIAGNOSIS — R197 Diarrhea, unspecified: Secondary | ICD-10-CM | POA: Diagnosis not present

## 2020-09-08 DIAGNOSIS — R1010 Upper abdominal pain, unspecified: Secondary | ICD-10-CM | POA: Diagnosis not present

## 2020-09-08 DIAGNOSIS — Z3A01 Less than 8 weeks gestation of pregnancy: Secondary | ICD-10-CM | POA: Diagnosis not present

## 2020-09-17 ENCOUNTER — Encounter: Payer: Self-pay | Admitting: Obstetrics

## 2020-09-17 ENCOUNTER — Other Ambulatory Visit (HOSPITAL_COMMUNITY)
Admission: RE | Admit: 2020-09-17 | Discharge: 2020-09-17 | Disposition: A | Discharge status: Home / Self Care | Payer: 59 | Source: Ambulatory Visit | Attending: Obstetrics | Admitting: Obstetrics

## 2020-09-17 ENCOUNTER — Ambulatory Visit (INDEPENDENT_AMBULATORY_CARE_PROVIDER_SITE_OTHER): Payer: 59 | Admitting: Obstetrics

## 2020-09-17 ENCOUNTER — Other Ambulatory Visit: Payer: Self-pay

## 2020-09-17 VITALS — BP 120/70 | Wt 189.0 lb

## 2020-09-17 DIAGNOSIS — Z3480 Encounter for supervision of other normal pregnancy, unspecified trimester: Secondary | ICD-10-CM | POA: Diagnosis not present

## 2020-09-17 DIAGNOSIS — Z124 Encounter for screening for malignant neoplasm of cervix: Secondary | ICD-10-CM | POA: Insufficient documentation

## 2020-09-17 DIAGNOSIS — Z3A08 8 weeks gestation of pregnancy: Secondary | ICD-10-CM

## 2020-09-17 LAB — POCT URINALYSIS DIPSTICK OB
Glucose, UA: NEGATIVE
POC,PROTEIN,UA: NEGATIVE

## 2020-09-18 ENCOUNTER — Emergency Department
Admission: EM | Admit: 2020-09-18 | Discharge: 2020-09-18 | Disposition: A | Discharge status: Home / Self Care | Payer: 59 | Attending: Emergency Medicine | Admitting: Emergency Medicine

## 2020-09-18 ENCOUNTER — Encounter: Payer: Self-pay | Admitting: Emergency Medicine

## 2020-09-18 ENCOUNTER — Other Ambulatory Visit: Payer: Self-pay

## 2020-09-18 DIAGNOSIS — O219 Vomiting of pregnancy, unspecified: Secondary | ICD-10-CM | POA: Insufficient documentation

## 2020-09-18 DIAGNOSIS — Z3A00 Weeks of gestation of pregnancy not specified: Secondary | ICD-10-CM | POA: Diagnosis not present

## 2020-09-18 DIAGNOSIS — Z3A01 Less than 8 weeks gestation of pregnancy: Secondary | ICD-10-CM | POA: Diagnosis not present

## 2020-09-18 LAB — BASIC METABOLIC PANEL
Anion gap: 5 (ref 5–15)
BUN: 6 mg/dL (ref 6–20)
CO2: 25 mmol/L (ref 22–32)
Calcium: 9.2 mg/dL (ref 8.9–10.3)
Chloride: 105 mmol/L (ref 98–111)
Creatinine, Ser: 0.69 mg/dL (ref 0.44–1.00)
GFR, Estimated: >60 mL/min (ref >60)
Glucose, Bld: 99 mg/dL (ref 70–99)
Potassium: 3.9 mmol/L (ref 3.5–5.1)
Sodium: 135 mmol/L (ref 135–145)

## 2020-09-18 LAB — CBC WITH DIFFERENTIAL/PLATELET
Abs Immature Granulocytes: 0.04 10*3/uL (ref 0.00–0.07)
Basophils Absolute: 0 10*3/uL (ref 0.0–0.1)
Basophils Relative: 0 %
Eosinophils Absolute: 0 10*3/uL (ref 0.0–0.5)
Eosinophils Relative: 0 %
HCT: 44.7 % (ref 36.0–46.0)
Hemoglobin: 15.3 g/dL — ABNORMAL HIGH (ref 12.0–15.0)
Immature Granulocytes: 0 %
Lymphocytes Relative: 21 %
Lymphs Abs: 2.1 10*3/uL (ref 0.7–4.0)
MCH: 33 pg (ref 26.0–34.0)
MCHC: 34.2 g/dL (ref 30.0–36.0)
MCV: 96.3 fL (ref 80.0–100.0)
Monocytes Absolute: 0.7 10*3/uL (ref 0.1–1.0)
Monocytes Relative: 7 %
Neutro Abs: 7.1 10*3/uL (ref 1.7–7.7)
Neutrophils Relative %: 72 %
Platelets: 213 10*3/uL (ref 150–400)
RBC: 4.64 MIL/uL (ref 3.87–5.11)
RDW: 13.2 % (ref 11.5–15.5)
WBC: 10 10*3/uL (ref 4.0–10.5)
nRBC: 0 % (ref 0.0–0.2)

## 2020-09-18 MED ORDER — SODIUM CHLORIDE 0.9 % IV BOLUS
1000.0000 mL | Freq: Once | INTRAVENOUS | Status: AC
  Administered 2020-09-18: 1000 mL via INTRAVENOUS

## 2020-09-18 MED ORDER — METOCLOPRAMIDE HCL 10 MG PO TABS: 10.0000 mg | ORAL_TABLET | Freq: Three times a day (TID) | ORAL | 0 refills | Status: DC | PRN

## 2020-09-18 MED ORDER — METOCLOPRAMIDE HCL 5 MG/ML IJ SOLN
10.0000 mg | Freq: Once | INTRAMUSCULAR | Status: AC
  Administered 2020-09-18: 10 mg via INTRAVENOUS
  Filled 2020-09-18: qty 2

## 2020-09-18 NOTE — Discharge Instructions (Addendum)
Please seek medical attention for any high fevers, chest pain, shortness of breath, change in behavior, persistent vomiting, bloody stool or any other new or concerning symptoms.  

## 2020-09-18 NOTE — ED Triage Notes (Signed)
Pt reports is [redacted] weeks pregnant and is very nauseated and vomiting. First pregnancy. Pt also reports some vaginal bleeding and saw MD yesterday and they are aware of the vaginal bleeidng

## 2020-09-18 NOTE — ED Notes (Signed)
PO challenge in process. Ginger ale and crackers provided.

## 2020-09-18 NOTE — ED Provider Notes (Signed)
[facility] Emergency Department Provider Note   ____________________________________________   I have reviewed the triage vital signs and the nursing notes.   HISTORY  Chief Complaint Emesis During Pregnancy   History limited by: Not Limited   HPI Pamela Kent is a 30 y.o. female who presents to the emergency department today because of concern for nausea and vomiting in setting of early pregnancy. The patient states that she has been having a hard time keeping down any PO. The patient denies any significant associated abdominal pain. The patient recently had her first prenatal visit. She has not tried any antiemetics at this point.   Records reviewed. Per medical record review patient has a history of US showing single intrauterine pregnancy and Rhogam administration roughly 10 days ago at [facility].  Past Medical History:  Diagnosis Date   Depression    PCOS (polycystic ovarian syndrome)    PMDD (premenstrual dysphoric disorder)    PTSD (post-traumatic stress disorder)     Patient Active Problem List   Diagnosis Date Noted   Supervision of other normal pregnancy, antepartum 09/17/2020   Severe recurrent major depression without psychotic features (HCC) 11/24/2015   PTSD (post-traumatic stress disorder) 11/24/2015    Past Surgical History:  Procedure Laterality Date   CYSTECTOMY     WISDOM TOOTH EXTRACTION      Prior to Admission medications   Medication Sig Start Date End Date Taking? Authorizing Provider  cephALEXin (KEFLEX) 500 MG capsule Take 1 capsule (500 mg total) by mouth 4 (four) times daily. Patient not taking: Reported on 09/17/2020 09/15/17   Zadie Rhine, MD  dicyclomine (BENTYL) 20 MG tablet Take 1 tablet (20 mg total) by mouth 3 (three) times daily as needed for spasms (abdominal cramps). 12/26/18 12/26/19  Shaune Pollack, MD  ondansetron (ZOFRAN ODT) 4 MG disintegrating tablet Take 1 tablet (4 mg total) by mouth every 8 (eight)  hours as needed for nausea or vomiting. Patient not taking: Reported on 09/17/2020 12/26/18   Shaune Pollack, MD  ondansetron (ZOFRAN) 4 MG tablet Take 1 tablet (4 mg total) by mouth every 8 (eight) hours as needed. Patient not taking: Reported on 09/17/2020 04/09/20   Phineas Semen, MD  ondansetron (ZOFRAN) 4 MG tablet TAKE 1 TABLET BY MOUTH EVERY 8 HOURS AS NEEDED Patient not taking: Reported on 09/17/2020 04/09/20 04/09/21  Wille Celeste, MD  oxyCODONE-acetaminophen (ROXICET) 5-325 MG tablet Take 1 tablet by mouth every 6 (six) hours as needed for severe pain. Patient not taking: Reported on 09/17/2020 03/05/16   Enid Derry, PA-C  promethazine (PHENERGAN) 25 MG suppository Place 1 suppository (25 mg total) rectally every 8 (eight) hours as needed for refractory nausea / vomiting. 12/26/18 12/26/19  Shaune Pollack, MD    Allergies Patient has no known allergies.  No family history on file.  Social History Social History   Tobacco Use   Smoking status: Never   Smokeless tobacco: Never  Substance Use Topics   Alcohol use: Yes    Comment: Occ on celebrations or holiday   Drug use: Yes    Types: Marijuana    Review of Systems Constitutional: No fever/chills Eyes: No visual changes. ENT: No sore throat. Cardiovascular: Denies chest pain. Respiratory: Denies shortness of breath. Gastrointestinal: No abdominal pain.  Positive for nausea and vomiting. Genitourinary: Positive for vaginal spotting. Musculoskeletal: Negative for back pain. Skin: Negative for rash. Neurological: Negative for headaches, focal weakness or numbness.  ____________________________________________   PHYSICAL EXAM:  VITAL SIGNS: ED  Triage Vitals [09/18/20 0824]  Enc Vitals Group     BP      Pulse      Resp      Temp      Temp src      SpO2      Weight 189 lb (85.7 kg)     Height 5\' 4"  (1.626 m)     Head Circumference      Peak Flow      Pain Score 0   Constitutional: Alert and  oriented.  Eyes: Conjunctivae are normal.  ENT      Head: Normocephalic and atraumatic.      Nose: No congestion/rhinnorhea.      Mouth/Throat: Mucous membranes are moist.      Neck: No stridor. Hematological/Lymphatic/Immunilogical: No cervical lymphadenopathy. Cardiovascular: Normal rate, regular rhythm.  No murmurs, rubs, or gallops.  Respiratory: Normal respiratory effort without tachypnea nor retractions. Breath sounds are clear and equal bilaterally. No wheezes/rales/rhonchi. Gastrointestinal: Soft and non tender. No rebound. No guarding.  Genitourinary: Deferred Musculoskeletal: Normal range of motion in all extremities. No lower extremity edema. Neurologic:  Normal speech and language. No gross focal neurologic deficits are appreciated.  Skin:  Skin is warm, dry and intact. No rash noted. Psychiatric: Mood and affect are normal. Speech and behavior are normal. Patient exhibits appropriate insight and judgment.  ____________________________________________    LABS (pertinent positives/negatives)  BMP wnl CBC wbc 10.0, hgb 15.3, plt 213  ____________________________________________   EKG  None  ____________________________________________    RADIOLOGY  None  ____________________________________________   PROCEDURES  Procedures  ____________________________________________   INITIAL IMPRESSION / ASSESSMENT AND PLAN / ED COURSE  Pertinent labs & imaging results that were available during my care of the patient were reviewed by me and considered in my medical decision making (see chart for details).   Patient presented to the emergency department today because of concern for nausea and vomiting in the setting of early pregnancy. Blood work without any concerning findings. The patient was given IVFs and antiemetic. Was able to tolerate PO afterwards. Did complain of some vaginal spotting. Had which showed intrauterine pregnancy. Additionally had rhogam  administered roughly 10 days ago. Discussed follow up with ob/gyn.   ____________________________________________   FINAL CLINICAL IMPRESSION(S) / ED DIAGNOSES  Final diagnoses:  Nausea and vomiting during pregnancy     Note: This dictation was prepared with Dragon dictation. Any transcriptional errors that result from this process are unintentional     [place], MD 09/18/20 1041

## 2020-09-19 LAB — URINE CULTURE

## 2020-09-23 ENCOUNTER — Other Ambulatory Visit: Payer: Self-pay | Admitting: Obstetrics

## 2020-09-23 NOTE — Progress Notes (Signed)
New Obstetric Patient H&P    Chief Complaint: "Desires prenatal care"   History of Present Illness: Patient is a 30 y.o. G1P0 Not Hispanic or Latino female, LMP 07/19/2020 presents with amenorrhea and positive home pregnancy test. Based on her  LMP, her EDD is Estimated Date of Delivery: 04/25/21 and her EGA is [redacted]w[redacted]d. Cycles are inconsistant., irregular, and occur approximately every : not applicable days. Her last pap smear was 1 years ago and was no abnormalities.    She had a urine pregnancy test which was positive a few week(s)  ago. Her last menstrual period was normal and lasted for  5 day(s). Since her LMP she claims she has experienced fatigue, nausea and vomiting and breast tenderness. She denies vaginal bleeding. Her past medical history is noncontributory. Her prior pregnancies are notable for none  Since her LMP, she admits to the use of tobacco products  no She claims she has gained   no pounds since the start of her pregnancy.  There are cats in the home in the home  no She admits close contact with children on a regular basis  yes  She has had chicken pox in the past yes She has had Tuberculosis exposures, symptoms, or previously tested positive for TB   no Current or past history of domestic violence. no  Genetic Screening/Teratology Counseling: (Includes patient, baby's father, or anyone in either family with:)   1. Patient's age >/= 49 at [facility]  no 2. Thalassemia ([place], [place], Mediterranean, or Asian background): MCV<80  no 3. Neural tube defect (meningomyelocele, spina bifida, anencephaly)  no 4. Congenital heart defect  no  5. Down syndrome  no 6. Tay-Sachs (Jewish, [place]  no 7. Canavan's Disease  no 8. Sickle cell disease or trait (African)  no  9. Hemophilia or other blood disorders  no  10. Muscular dystrophy  no  11. Cystic fibrosis  no  12. Huntington's Chorea  no  13. Mental retardation/autism  no 14. Other inherited genetic or  chromosomal disorder  no 15. Maternal metabolic disorder (DM, PKU, etc)  no 16. Patient or FOB with a child with a birth defect not listed above no  16a. Patient or FOB with a birth defect themselves no 17. Recurrent pregnancy loss, or stillbirth  no  18. Any medications since LMP other than prenatal vitamins (include vitamins, supplements, OTC meds, drugs, alcohol)  no 19. Any other genetic/environmental exposure to discuss  no  Infection History:   1. Lives with someone with TB or TB exposed  no  2. Patient or partner has history of genital herpes She is uncertain about HSV 3. Rash or viral illness since LMP  no 4. History of STI (GC, CT, HPV, syphilis, HIV)  Yes- Trichomonas, Chlamydia, GC 5. History of recent travel :  no  Other pertinent information:  no     Review of Systems:10 point review of systems negative unless otherwise noted in HPI  Past Medical History:  Past Medical History:  Diagnosis Date  . Depression   . PCOS (polycystic ovarian syndrome)   . PMDD (premenstrual dysphoric disorder)   . PTSD (post-traumatic stress disorder)     Past Surgical History:  Past Surgical History:  Procedure Laterality Date  . CYSTECTOMY    . WISDOM TOOTH EXTRACTION      Gynecologic History: Patient's last menstrual period was 07/19/2020.  Obstetric History: G1P0  Family History:  No family history on file.  Social History:  Social History   Socioeconomic History  . Marital status: Single    Spouse name: Not on file  . Number of children: Not on file  . Years of education: Not on file  . Highest education level: Not on file  Occupational History  . Not on file  Tobacco Use  . Smoking status: Never  . Smokeless tobacco: Never  Substance and Sexual Activity  . Alcohol use: Yes    Comment: Occ on celebrations or holiday  . Drug use: Yes    Types: Marijuana  . Sexual activity: Not on file  Other Topics Concern  . Not on file  Social History Narrative  . Not  on file   Social Determinants of Health   Financial Resource Strain: Not on file  Food Insecurity: Not on file  Transportation Needs: Not on file  Physical Activity: Not on file  Stress: Not on file  Social Connections: Not on file  Intimate Partner Violence: Not on file    Allergies:  No Known Allergies  Medications: Prior to Admission medications   Medication Sig Start Date End Date Taking? Authorizing Provider  cephALEXin (KEFLEX) 500 MG capsule Take 1 capsule (500 mg total) by mouth 4 (four) times daily. Patient not taking: Reported on 09/17/2020 09/15/17   Zadie Rhine, MD  dicyclomine (BENTYL) 20 MG tablet Take 1 tablet (20 mg total) by mouth 3 (three) times daily as needed for spasms (abdominal cramps). 12/26/18 12/26/19  Shaune Pollack, MD  metoCLOPramide (REGLAN) 10 MG tablet Take 1 tablet (10 mg total) by mouth every 8 (eight) hours as needed for nausea or vomiting. 09/18/20 09/18/21  Phineas Semen, MD  ondansetron (ZOFRAN ODT) 4 MG disintegrating tablet Take 1 tablet (4 mg total) by mouth every 8 (eight) hours as needed for nausea or vomiting. Patient not taking: Reported on 09/17/2020 12/26/18   Shaune Pollack, MD  ondansetron (ZOFRAN) 4 MG tablet Take 1 tablet (4 mg total) by mouth every 8 (eight) hours as needed. Patient not taking: Reported on 09/17/2020 04/09/20   Phineas Semen, MD  ondansetron (ZOFRAN) 4 MG tablet TAKE 1 TABLET BY MOUTH EVERY 8 HOURS AS NEEDED Patient not taking: Reported on 09/17/2020 04/09/20 04/09/21  Wille Celeste, MD  oxyCODONE-acetaminophen (ROXICET) 5-325 MG tablet Take 1 tablet by mouth every 6 (six) hours as needed for severe pain. Patient not taking: Reported on 09/17/2020 03/05/16   Enid Derry, PA-C  promethazine (PHENERGAN) 25 MG suppository Place 1 suppository (25 mg total) rectally every 8 (eight) hours as needed for refractory nausea / vomiting. 12/26/18 12/26/19  Shaune Pollack, MD    Physical Exam Vitals: Blood pressure  120/70, weight 189 lb (85.7 kg), last menstrual period 07/19/2020.  General: NAD HEENT: normocephalic, anicteric Thyroid: no enlargement, no palpable nodules Pulmonary: No increased work of breathing, CTAB Cardiovascular: RRR, distal pulses 2+ Abdomen: NABS, soft, non-tender, non-distended.  Umbilicus without lesions.  No hepatomegaly, splenomegaly or masses palpable. No evidence of hernia  Genitourinary:  External: Normal external female genitalia.  Normal urethral meatus, normal  Bartholin's and Skene's glands.    Vagina: Normal vaginal mucosa, no evidence of prolapse.    Cervix: Grossly normal in appearance, no bleeding  Uterus: anteverted Non-enlarged, mobile, normal contour.  No CMT  Adnexa: ovaries non-enlarged, no adnexal masses  Rectal: deferred Extremities: no edema, erythema, or tenderness Neurologic: Grossly intact Psychiatric: mood appropriate, affect full   Assessment: 30 y.o. G1P0 at [redacted]w[redacted]d presenting to initiate prenatal care  Plan: 1) Avoid alcoholic beverages.  2) Patient encouraged not to smoke.  3) Discontinue the use of all non-medicinal drugs and chemicals.  4) Take prenatal vitamins daily.  5) Nutrition, food safety (fish, cheese advisories, and high nitrite foods) and exercise discussed. 6) Hospital and practice style discussed with cross coverage system.  7) Genetic Screening, such as with 1st Trimester Screening, cell free fetal DNA, AFP testing, and Ultrasound, as well as with amniocentesis and CVS as appropriate, is discussed with patient. At the conclusion of today's visit patient requested genetic testing 8) Patient is asked about travel to areas at risk for the Zika virus, and counseled to avoid travel and exposure to mosquitoes or sexual partners who may have themselves been exposed to the virus. Testing is discussed, and will be ordered as appropriate.  A dating scan is ordered for her with one of the office physicians. She is interested in the maternT  test. We will add an HSV blood drawy to her NOB labs. A prescription for zofran is sent in for her.  Mirna Mires, CNM  09/23/2020 11:42 AM

## 2020-09-24 LAB — CYTOLOGY - PAP
Chlamydia: NEGATIVE
Comment: NEGATIVE
Comment: NEGATIVE
Comment: NORMAL
Diagnosis: NEGATIVE
Neisseria Gonorrhea: NEGATIVE
Trichomonas: POSITIVE — AB

## 2020-09-27 ENCOUNTER — Other Ambulatory Visit: Payer: Self-pay | Admitting: Obstetrics

## 2020-09-27 MED ORDER — METRONIDAZOLE 500 MG PO TABS
500.0000 mg | ORAL_TABLET | Freq: Two times a day (BID) | ORAL | 0 refills | Status: AC
  Filled 2020-09-27: qty 14, 7d supply, fill #0

## 2020-09-27 NOTE — Progress Notes (Signed)
Patient tests + for Trichomonas at her NOB visit. Attempted to contact the patient by phone, but no answer. Left VM alerting her to the fact I have sent in a Rx for her, and that it relates to her lab results. Advised her to contact us by phone tomorrow (09/28/2020) for education and to clarify her lab tests. Mirna Mires, CNM  09/27/2020 6:20 PM

## 2020-09-28 ENCOUNTER — Other Ambulatory Visit (HOSPITAL_BASED_OUTPATIENT_CLINIC_OR_DEPARTMENT_OTHER): Payer: Self-pay

## 2020-09-29 ENCOUNTER — Encounter: Payer: 59 | Admitting: Obstetrics and Gynecology

## 2020-09-29 DIAGNOSIS — F4321 Adjustment disorder with depressed mood: Secondary | ICD-10-CM | POA: Diagnosis not present

## 2020-09-30 DIAGNOSIS — F4310 Post-traumatic stress disorder, unspecified: Secondary | ICD-10-CM | POA: Diagnosis not present

## 2020-09-30 DIAGNOSIS — F332 Major depressive disorder, recurrent severe without psychotic features: Secondary | ICD-10-CM | POA: Diagnosis not present

## 2020-09-30 DIAGNOSIS — F4321 Adjustment disorder with depressed mood: Secondary | ICD-10-CM | POA: Diagnosis not present

## 2020-10-01 DIAGNOSIS — N760 Acute vaginitis: Secondary | ICD-10-CM | POA: Diagnosis not present

## 2020-10-01 DIAGNOSIS — F4321 Adjustment disorder with depressed mood: Secondary | ICD-10-CM | POA: Diagnosis not present

## 2020-10-05 DIAGNOSIS — F4321 Adjustment disorder with depressed mood: Secondary | ICD-10-CM | POA: Diagnosis not present

## 2020-10-06 ENCOUNTER — Encounter: Payer: Self-pay | Admitting: Obstetrics and Gynecology

## 2020-10-11 DIAGNOSIS — F4321 Adjustment disorder with depressed mood: Secondary | ICD-10-CM | POA: Diagnosis not present

## 2020-10-14 ENCOUNTER — Other Ambulatory Visit (HOSPITAL_BASED_OUTPATIENT_CLINIC_OR_DEPARTMENT_OTHER): Payer: Self-pay

## 2020-10-18 DIAGNOSIS — F4321 Adjustment disorder with depressed mood: Secondary | ICD-10-CM | POA: Diagnosis not present

## 2020-10-22 ENCOUNTER — Encounter (HOSPITAL_COMMUNITY): Payer: Self-pay | Admitting: *Deleted

## 2020-10-22 ENCOUNTER — Other Ambulatory Visit: Payer: Self-pay

## 2020-10-22 ENCOUNTER — Emergency Department (HOSPITAL_COMMUNITY)
Admission: EM | Admit: 2020-10-22 | Discharge: 2020-10-22 | Disposition: A | Discharge status: Home / Self Care | Payer: 59 | Attending: Emergency Medicine | Admitting: Emergency Medicine

## 2020-10-22 DIAGNOSIS — R638 Other symptoms and signs concerning food and fluid intake: Secondary | ICD-10-CM | POA: Insufficient documentation

## 2020-10-22 DIAGNOSIS — R55 Syncope and collapse: Secondary | ICD-10-CM | POA: Insufficient documentation

## 2020-10-22 LAB — CBC WITH DIFFERENTIAL/PLATELET
Abs Immature Granulocytes: 0.01 10*3/uL (ref 0.00–0.07)
Basophils Absolute: 0 10*3/uL (ref 0.0–0.1)
Basophils Relative: 1 %
Eosinophils Absolute: 0 10*3/uL (ref 0.0–0.5)
Eosinophils Relative: 0 %
HCT: 43.5 % (ref 36.0–46.0)
Hemoglobin: 14.9 g/dL (ref 12.0–15.0)
Immature Granulocytes: 0 %
Lymphocytes Relative: 40 %
Lymphs Abs: 2.9 10*3/uL (ref 0.7–4.0)
MCH: 33.1 pg (ref 26.0–34.0)
MCHC: 34.3 g/dL (ref 30.0–36.0)
MCV: 96.7 fL (ref 80.0–100.0)
Monocytes Absolute: 0.7 10*3/uL (ref 0.1–1.0)
Monocytes Relative: 10 %
Neutro Abs: 3.6 10*3/uL (ref 1.7–7.7)
Neutrophils Relative %: 49 %
Platelets: 198 10*3/uL (ref 150–400)
RBC: 4.5 MIL/uL (ref 3.87–5.11)
RDW: 12.7 % (ref 11.5–15.5)
WBC: 7.2 10*3/uL (ref 4.0–10.5)
nRBC: 0 % (ref 0.0–0.2)

## 2020-10-22 LAB — BASIC METABOLIC PANEL
Anion gap: 6 (ref 5–15)
BUN: 9 mg/dL (ref 6–20)
CO2: 26 mmol/L (ref 22–32)
Calcium: 8.9 mg/dL (ref 8.9–10.3)
Chloride: 102 mmol/L (ref 98–111)
Creatinine, Ser: 0.9 mg/dL (ref 0.44–1.00)
GFR, Estimated: >60 mL/min (ref >60)
Glucose, Bld: 89 mg/dL (ref 70–99)
Potassium: 3.4 mmol/L — ABNORMAL LOW (ref 3.5–5.1)
Sodium: 134 mmol/L — ABNORMAL LOW (ref 135–145)

## 2020-10-22 LAB — HCG, QUANTITATIVE, PREGNANCY: hCG, Beta Chain, Quant, S: 12 m[IU]/mL — ABNORMAL HIGH (ref <5)

## 2020-10-22 MED ORDER — HYDROXYZINE HCL 25 MG PO TABS
25.0000 mg | ORAL_TABLET | Freq: Once | ORAL | Status: AC
  Administered 2020-10-22: 25 mg via ORAL
  Filled 2020-10-22: qty 1

## 2020-10-22 MED ORDER — SODIUM CHLORIDE 0.9 % IV BOLUS
1000.0000 mL | Freq: Once | INTRAVENOUS | Status: AC
  Administered 2020-10-22: 1000 mL via INTRAVENOUS

## 2020-10-22 MED ORDER — HYDROXYZINE HCL 25 MG PO TABS: 50.0000 mg | ORAL_TABLET | Freq: Four times a day (QID) | ORAL | 0 refills | Status: AC | PRN

## 2020-10-22 NOTE — Discharge Instructions (Addendum)
Follow up with your outpatient psychiatrist and therapist.   -Prescription sent to the pharmacy for Vistaril.  This is to help with anxiety.  It is for as needed use.  Take as prescribed.  Make sure you stay well-hydrated and drink plenty of fluids.  Return to the ER for any new or worsening conditions.

## 2020-10-22 NOTE — ED Provider Notes (Signed)
   Patient signed out to me by Namon Cirri, PA-C at end of shift pending completion of work-up.  Patient here for evaluation of near syncopal episode possible dehydration.  Patient noted to have abortion on 09/24/2020.  She has been feeling anxious and depressed about her recent procedure.  No SI or HI.  Her labs this evening reassuring.  Quantitative hCG of 12.  Patient advised of lab results.  Previous provider prescribed medication to help with her anxiety.  She does have good support system at home and she plans to follow-up with outpatient psych.  Patient appears appropriate for discharge home.  All questions were answered.  Labs Reviewed  BASIC METABOLIC PANEL - Abnormal; Notable for the following components:      Result Value   Sodium 134 (*)    Potassium 3.4 (*)    All other components within normal limits  HCG, QUANTITATIVE, PREGNANCY - Abnormal; Notable for the following components:   hCG, Beta Chain, Quant, S 12 (*)    All other components within normal limits  CBC WITH DIFFERENTIAL/PLATELET      Pauline Aus, PA-C 10/22/20 1950    Eber Hong, MD 10/23/20 1224

## 2020-10-22 NOTE — ED Triage Notes (Signed)
Pt not eating and drinking well due to personal issues recently.  Near syncope today.  Pt is going through therapy.

## 2020-10-22 NOTE — ED Provider Notes (Signed)
[facility] EMERGENCY DEPARTMENT Provider Note   CSN: [id number] Arrival date & time: 10/22/20  1644     History Chief Complaint  Patient presents with   Near Syncope    Pamela Kent is a 30 y.o. female G1P0A1 with past medical history significant for depression, PCOS, premenstrual dysphoric disorder, PTSD presenting to emergency department today with chief complaint of near syncope happening just prior to arrival.  Patient states she was sitting at home when she felt like he was going to pass out she felt like her vision was going black although she never did fully lose consciousness.  Patient admits to being under a lot of stress recently.  Patient had an unplanned pregnancy and had an abortion on 09/24/2020 at a clinic in [place]. She states she had an elective D&C at approximately 12 weeks.  Ever since having the procedure she admits to feeling very depressed and questioning if she should have gone through with it.  She states she has had significantly decreased p.o. intake over the last 3 days.  She is currently seeing an outpatient therapist and psychiatrist.  She denies any thoughts of self-harm or homicidal ideations.  She had no prodrome of chest pain or shortness of breath prior to this happening.  Patient denies any drug or alcohol use.  She denies any recent illness.  She has been staying at her father's house and feels like she has a safe place to go home to.  Denies any family history of cardiac disease in parents or siblings.      Past Medical History:  Diagnosis Date   Depression    Family history of pancreatic cancer    7/22 cancer genetic testing letter sent   PCOS (polycystic ovarian syndrome)    PMDD (premenstrual dysphoric disorder)    PTSD (post-traumatic stress disorder)     Patient Active Problem List   Diagnosis Date Noted   Supervision of other normal pregnancy, antepartum 09/17/2020   Severe recurrent major depression without psychotic features (HCC)  11/24/2015   PTSD (post-traumatic stress disorder) 11/24/2015    Past Surgical History:  Procedure Laterality Date   CYSTECTOMY     WISDOM TOOTH EXTRACTION       OB History     Gravida  1   Para      Term      Preterm      AB      Living         SAB      IAB      Ectopic      Multiple      Live Births              Family History  Problem Relation Age of Onset   Brain cancer Mother    Pancreatic cancer Brother     Social History   Tobacco Use   Smoking status: Never   Smokeless tobacco: Never  Substance Use Topics   Alcohol use: Yes    Comment: Occ on celebrations or holiday   Drug use: Yes    Types: Marijuana    Home Medications Prior to Admission medications   Medication Sig Start Date End Date Taking? Authorizing Provider  cephALEXin (KEFLEX) 500 MG capsule Take 1 capsule (500 mg total) by mouth 4 (four) times daily. Patient not taking: Reported on 09/17/2020 09/15/17   Zadie Rhine, MD  dicyclomine (BENTYL) 20 MG tablet Take 1 tablet (20 mg total) by mouth 3 (three) times daily  as needed for spasms (abdominal cramps). 12/26/18 12/26/19  Shaune Pollack, MD  metoCLOPramide (REGLAN) 10 MG tablet Take 1 tablet (10 mg total) by mouth every 8 (eight) hours as needed for nausea or vomiting. 09/18/20 09/18/21  Phineas Semen, MD  ondansetron (ZOFRAN ODT) 4 MG disintegrating tablet Take 1 tablet (4 mg total) by mouth every 8 (eight) hours as needed for nausea or vomiting. Patient not taking: Reported on 09/17/2020 12/26/18   Shaune Pollack, MD  promethazine (PHENERGAN) 25 MG suppository Place 1 suppository (25 mg total) rectally every 8 (eight) hours as needed for refractory nausea / vomiting. 12/26/18 12/26/19  Shaune Pollack, MD    Allergies    Patient has no known allergies.  Review of Systems   Review of Systems All other systems are reviewed and are negative for acute change except as noted in the HPI.  Physical Exam Updated Vital Signs BP  (!) 123/96 (BP Location: Right Arm)   Pulse 74   Temp 98.4 F (36.9 C) (Oral)   Resp 14   Ht 5\' 4"  (1.626 m)   Wt 81.6 kg   LMP 07/19/2020   SpO2 100%   Breastfeeding Unknown Comment: abortion on 7/15  BMI 30.90 kg/m   Physical Exam Vitals and nursing note reviewed.  Constitutional:      General: She is not in acute distress.    Appearance: She is not ill-appearing.  HENT:     Head: Normocephalic and atraumatic.     Right Ear: Tympanic membrane and external ear normal.     Left Ear: Tympanic membrane and external ear normal.     Nose: Nose normal.     Mouth/Throat:     Mouth: Mucous membranes are moist.     Pharynx: Oropharynx is clear.  Eyes:     General: No scleral icterus.       Right eye: No discharge.        Left eye: No discharge.     Extraocular Movements: Extraocular movements intact.     Conjunctiva/sclera: Conjunctivae normal.     Pupils: Pupils are equal, round, and reactive to light.  Neck:     Vascular: No JVD.  Cardiovascular:     Rate and Rhythm: Normal rate and regular rhythm.     Pulses: Normal pulses.          Radial pulses are 2+ on the right side and 2+ on the left side.     Heart sounds: Normal heart sounds.  Pulmonary:     Comments: Lungs clear to auscultation in all fields. Symmetric chest rise. No wheezing, rales, or rhonchi. Abdominal:     Comments: Abdomen is soft, non-distended, and non-tender in all quadrants. No rigidity, no guarding. No peritoneal signs.  Musculoskeletal:        General: Normal range of motion.     Cervical back: Normal range of motion.  Skin:    General: Skin is warm and dry.     Capillary Refill: Capillary refill takes less than 2 seconds.  Neurological:     Mental Status: She is oriented to person, place, and time.     GCS: GCS eye subscore is 4. GCS verbal subscore is 5. GCS motor subscore is 6.     Comments: Fluent speech, no facial droop.  Psychiatric:        Mood and Affect: Affect is tearful.         Behavior: Behavior normal.        Thought Content: Thought  content does not include homicidal or suicidal ideation. Thought content does not include homicidal or suicidal plan.    ED Results / Procedures / Treatments   Labs (all labs ordered are listed, but only abnormal results are displayed) Labs Reviewed  CBC WITH DIFFERENTIAL/PLATELET  BASIC METABOLIC PANEL  HCG, QUANTITATIVE, PREGNANCY    EKG EKG Interpretation  Date/Time:  Friday October 22 2020 17:12:09 EDT Ventricular Rate:  63 PR Interval:  144 QRS Duration: 94 QT Interval:  394 QTC Calculation: 403 R Axis:   82 Text Interpretation: Normal sinus rhythm Normal ECG Confirmed by Eber Hong ([id number]) on 10/22/2020 6:46:47 PM  Radiology No results found.  Procedures Procedures   Medications Ordered in ED Medications  sodium chloride 0.9 % bolus 1,000 mL (1,000 mLs Intravenous New Bag/Given 10/22/20 1820)  hydrOXYzine (ATARAX/VISTARIL) tablet 25 mg (25 mg Oral Given 10/22/20 1824)    ED Course  I have reviewed the triage vital signs and the nursing notes.  Pertinent labs & imaging results that were available during my care of the patient were reviewed by me and considered in my medical decision making (see chart for details).    MDM Rules/Calculators/A&P                           History provided by patient with additional history obtained from chart review.    Patient presenting with chief complaint of near syncope.  On my exam she is well-appearing in no acute distress.  Vital signs are stable.  Upon further discussion patient is experiencing anxiety and depression at home with decreased p.o. intake.  Concern for possible dehydration as mucous membranes do look dry on exam.  She has no abdominal tenderness.  Had a D&C x1 month ago, has not had any vaginal bleeding or severe abdominal pain.  She denies any suicidal or homicidal ideations.  She is followed by outpatient psychiatry and has a therapist.  She feels safe  at home staying with her father. CBC and BMP are overall unremarkable.  Patient given a liter of IV fluids, p.o. Vistaril and is feeling better when reassessed.  EKG shows normal sinus rhythm.  Patient's near syncope does not sound cardiac in nature.  Had lengthy discussion with patient regarding mental health.  She does not feel that she needs to have a TTS evaluation here. She plans to follow up closely with outpatient psych.  I did send short course of for Vistaril to her pharmacy for as needed use for her anxiety.  Patient care transferred to T. Triplett PA-C at the end of my shift pending HCG. Patient presentation, ED course, and plan of care discussed with review of all pertinent labs and imaging. Please see her note for further details regarding further ED course and disposition.   Portions of this note were generated with Scientist, clinical (histocompatibility and immunogenetics). Dictation errors may occur despite best attempts at proofreading.  Final Clinical Impression(s) / ED Diagnoses Final diagnoses:  Near syncope    Rx / DC Orders ED Discharge Orders          Ordered    hydrOXYzine (ATARAX/VISTARIL) 25 MG tablet  4 times daily PRN        10/22/20 1916             Shanon Ace, PA-C 10/22/20 1919    Eber Hong, MD 10/23/20 1224

## 2020-10-25 DIAGNOSIS — F4321 Adjustment disorder with depressed mood: Secondary | ICD-10-CM | POA: Diagnosis not present

## 2020-10-29 ENCOUNTER — Other Ambulatory Visit: Payer: Self-pay

## 2020-10-29 MED ORDER — TRAZODONE HCL 50 MG PO TABS
ORAL_TABLET | ORAL | 0 refills | Status: DC
  Filled 2020-10-29: qty 60, 30d supply, fill #0

## 2020-11-01 ENCOUNTER — Other Ambulatory Visit: Payer: Self-pay

## 2020-11-01 DIAGNOSIS — F4310 Post-traumatic stress disorder, unspecified: Secondary | ICD-10-CM | POA: Diagnosis not present

## 2020-11-01 DIAGNOSIS — F332 Major depressive disorder, recurrent severe without psychotic features: Secondary | ICD-10-CM | POA: Diagnosis not present

## 2020-11-01 DIAGNOSIS — F4321 Adjustment disorder with depressed mood: Secondary | ICD-10-CM | POA: Diagnosis not present

## 2020-11-01 MED ORDER — ESCITALOPRAM OXALATE 5 MG PO TABS
ORAL_TABLET | ORAL | 0 refills | Status: DC
  Filled 2020-11-01: qty 21, 21d supply, fill #0

## 2020-11-08 DIAGNOSIS — F4321 Adjustment disorder with depressed mood: Secondary | ICD-10-CM | POA: Diagnosis not present

## 2020-11-11 ENCOUNTER — Other Ambulatory Visit: Payer: Self-pay

## 2020-11-11 DIAGNOSIS — F4310 Post-traumatic stress disorder, unspecified: Secondary | ICD-10-CM | POA: Diagnosis not present

## 2020-11-11 DIAGNOSIS — F4321 Adjustment disorder with depressed mood: Secondary | ICD-10-CM | POA: Diagnosis not present

## 2020-11-11 DIAGNOSIS — F332 Major depressive disorder, recurrent severe without psychotic features: Secondary | ICD-10-CM | POA: Diagnosis not present

## 2020-11-11 MED ORDER — ESCITALOPRAM OXALATE 10 MG PO TABS
ORAL_TABLET | ORAL | 1 refills | Status: DC
  Filled 2020-11-11: qty 30, 30d supply, fill #0

## 2020-11-15 DIAGNOSIS — F4321 Adjustment disorder with depressed mood: Secondary | ICD-10-CM | POA: Diagnosis not present

## 2020-11-22 ENCOUNTER — Other Ambulatory Visit: Payer: Self-pay

## 2020-11-23 ENCOUNTER — Other Ambulatory Visit: Payer: Self-pay

## 2020-11-23 DIAGNOSIS — F4310 Post-traumatic stress disorder, unspecified: Secondary | ICD-10-CM | POA: Diagnosis not present

## 2020-11-23 DIAGNOSIS — F4321 Adjustment disorder with depressed mood: Secondary | ICD-10-CM | POA: Diagnosis not present

## 2020-11-23 DIAGNOSIS — F332 Major depressive disorder, recurrent severe without psychotic features: Secondary | ICD-10-CM | POA: Diagnosis not present

## 2020-11-23 MED ORDER — VENLAFAXINE HCL ER 37.5 MG PO CP24
ORAL_CAPSULE | ORAL | 0 refills | Status: DC
  Filled 2020-11-23: qty 50, 30d supply, fill #0

## 2020-12-08 ENCOUNTER — Other Ambulatory Visit: Payer: Self-pay

## 2020-12-08 DIAGNOSIS — F4321 Adjustment disorder with depressed mood: Secondary | ICD-10-CM | POA: Diagnosis not present

## 2020-12-08 DIAGNOSIS — F332 Major depressive disorder, recurrent severe without psychotic features: Secondary | ICD-10-CM | POA: Diagnosis not present

## 2020-12-08 DIAGNOSIS — F4310 Post-traumatic stress disorder, unspecified: Secondary | ICD-10-CM | POA: Diagnosis not present

## 2020-12-08 MED ORDER — VENLAFAXINE HCL ER 75 MG PO CP24
ORAL_CAPSULE | ORAL | 1 refills | Status: DC
  Filled 2020-12-08: qty 30, 30d supply, fill #0

## 2020-12-08 MED ORDER — VENLAFAXINE HCL ER 37.5 MG PO CP24
ORAL_CAPSULE | ORAL | 0 refills | Status: DC
  Filled 2020-12-08: qty 50, 25d supply, fill #0

## 2020-12-21 ENCOUNTER — Other Ambulatory Visit: Payer: Self-pay

## 2021-01-07 ENCOUNTER — Other Ambulatory Visit: Payer: Self-pay

## 2021-01-14 ENCOUNTER — Other Ambulatory Visit: Payer: Self-pay

## 2021-03-08 DIAGNOSIS — Z20822 Contact with and (suspected) exposure to covid-19: Secondary | ICD-10-CM | POA: Diagnosis not present

## 2021-03-30 ENCOUNTER — Other Ambulatory Visit: Payer: Self-pay

## 2021-03-30 DIAGNOSIS — F332 Major depressive disorder, recurrent severe without psychotic features: Secondary | ICD-10-CM | POA: Diagnosis not present

## 2021-03-30 MED ORDER — LORAZEPAM 0.5 MG PO TABS
ORAL_TABLET | ORAL | 2 refills | Status: AC
  Filled 2021-03-30 – 2021-04-18 (×2): qty 30, 30d supply, fill #0

## 2021-03-30 MED ORDER — TRAZODONE HCL 50 MG PO TABS
ORAL_TABLET | ORAL | 3 refills | Status: DC
  Filled 2021-03-30: qty 90, 90d supply, fill #0

## 2021-04-14 ENCOUNTER — Other Ambulatory Visit: Payer: Self-pay

## 2021-04-18 ENCOUNTER — Other Ambulatory Visit: Payer: Self-pay

## 2021-06-21 DIAGNOSIS — Z0000 Encounter for general adult medical examination without abnormal findings: Secondary | ICD-10-CM | POA: Diagnosis not present

## 2021-08-08 ENCOUNTER — Ambulatory Visit
Admission: RE | Admit: 2021-08-08 | Discharge: 2021-08-08 | Disposition: A | Discharge status: Home / Self Care | Payer: 59 | Source: Ambulatory Visit | Attending: Emergency Medicine | Admitting: Emergency Medicine

## 2021-08-08 VITALS — BP 131/83 | HR 71 | Temp 98.4°F | Resp 16 | Ht 64.0 in | Wt 179.9 lb

## 2021-08-08 DIAGNOSIS — N898 Other specified noninflammatory disorders of vagina: Secondary | ICD-10-CM | POA: Insufficient documentation

## 2021-08-08 DIAGNOSIS — Z113 Encounter for screening for infections with a predominantly sexual mode of transmission: Secondary | ICD-10-CM | POA: Insufficient documentation

## 2021-08-08 LAB — POCT URINALYSIS DIP (MANUAL ENTRY)
Bilirubin, UA: NEGATIVE
Blood, UA: NEGATIVE
Glucose, UA: NEGATIVE mg/dL
Ketones, POC UA: NEGATIVE mg/dL
Nitrite, UA: NEGATIVE
Protein Ur, POC: NEGATIVE mg/dL
Spec Grav, UA: 1.025 (ref 1.010–1.025)
Urobilinogen, UA: 0.2 E.U./dL
pH, UA: 6 (ref 5.0–8.0)

## 2021-08-08 LAB — POCT URINE PREGNANCY: Preg Test, Ur: NEGATIVE

## 2021-08-08 MED ORDER — METRONIDAZOLE 0.75 % VA GEL: 1.0000 | Freq: Every day | VAGINAL | 0 refills | Status: DC

## 2021-08-08 NOTE — Discharge Instructions (Addendum)
Use the Metro Gel as directed.      Your vaginal tests are pending.  If your test results are positive, we will call you.  You and your sexual partner(s) may require treatment at that time.  Do not have sexual activity for at least 7 days.    Follow up with your primary care provider if your symptoms are not improving.

## 2021-08-08 NOTE — ED Triage Notes (Addendum)
Pt reports vaginal itching, swelling, faint odor and irritation x 5 days.  States used two days of monistat with little relief.

## 2021-08-08 NOTE — ED Provider Notes (Signed)
[facility]    CSN: [id number] Arrival date & time: 08/08/21  1705      History   Chief Complaint Chief Complaint  Patient presents with   Vaginal Itching    Entered by patient    HPI Pamela Kent is a 31 y.o. female.  Patient presents with vaginal itching and irritation x5 days.  She reports scant malodorous vaginal discharge.  She denies fever, chills, abdominal pain, dysuria, hematuria, flank pain, pelvic pain, or other symptoms.  Treatment at home with 2 days of OTC Monistat.  Patient states her symptoms are similar to previous episodes of bacterial vaginitis.  The history is provided by the patient and medical records.   Past Medical History:  Diagnosis Date   Depression    Family history of pancreatic cancer    7/22 cancer genetic testing letter sent   PCOS (polycystic ovarian syndrome)    PMDD (premenstrual dysphoric disorder)    PTSD (post-traumatic stress disorder)     Patient Active Problem List   Diagnosis Date Noted   Severe recurrent major depression without psychotic features (HCC) 11/24/2015   PTSD (post-traumatic stress disorder) 11/24/2015    Past Surgical History:  Procedure Laterality Date   CYSTECTOMY     WISDOM TOOTH EXTRACTION      OB History     Gravida  1   Para      Term      Preterm      AB  1   Living         SAB      IAB  1   Ectopic      Multiple      Live Births               Home Medications    Prior to Admission medications   Medication Sig Start Date End Date Taking? Authorizing Provider  metroNIDAZOLE (METROGEL VAGINAL) 0.75 % vaginal gel Place 1 Applicatorful vaginally at bedtime for 5 days. 08/08/21 08/13/21 Yes Mickie Bailate, Jivan Symanski H, NP  cephALEXin (KEFLEX) 500 MG capsule Take 1 capsule (500 mg total) by mouth 4 (four) times daily. Patient not taking: Reported on 09/17/2020 09/15/17   Zadie RhineWickline, Donald, MD  dicyclomine (BENTYL) 20 MG tablet Take 1 tablet (20 mg total) by mouth 3 (three) times daily as  needed for spasms (abdominal cramps). 12/26/18 12/26/19  Shaune PollackIsaacs, Cameron, MD  escitalopram (LEXAPRO) 10 MG tablet Take 1 oral tablet once a day 11/11/20     escitalopram (LEXAPRO) 5 MG tablet Take 1 oral tablet once a day 11/01/20     LORazepam (ATIVAN) 0.5 MG tablet Take 1 tablet (0.5 mg total) by mouth daily as needed for anxiety. 03/30/21     metoCLOPramide (REGLAN) 10 MG tablet Take 1 tablet (10 mg total) by mouth every 8 (eight) hours as needed for nausea or vomiting. 09/18/20 09/18/21  Phineas SemenGoodman, Graydon, MD  ondansetron (ZOFRAN ODT) 4 MG disintegrating tablet Take 1 tablet (4 mg total) by mouth every 8 (eight) hours as needed for nausea or vomiting. Patient not taking: Reported on 09/17/2020 12/26/18   Shaune PollackIsaacs, Cameron, MD  promethazine (PHENERGAN) 25 MG suppository Place 1 suppository (25 mg total) rectally every 8 (eight) hours as needed for refractory nausea / vomiting. 12/26/18 12/26/19  Shaune PollackIsaacs, Cameron, MD  traZODone (DESYREL) 50 MG tablet Take 1-2 tablets by mouth at bedtime for sleep 10/26/20     traZODone (DESYREL) 50 MG tablet Take 1 tablet (50 mg total) by mouth nightly.  03/30/21     venlafaxine XR (EFFEXOR-XR) 37.5 MG 24 hr capsule TAKE ONE TABLET BY MOUTH DAILY FOR 7 DAYS, THEN 2 TABLETS DAILY 12/08/20     venlafaxine XR (EFFEXOR-XR) 75 MG 24 hr capsule Take one capsule by mouth once a day 12/08/20       Family History Family History  Problem Relation Age of Onset   Brain cancer Mother    Pancreatic cancer Brother     Social History Social History   Tobacco Use   Smoking status: Never   Smokeless tobacco: Never  Substance Use Topics   Alcohol use: Yes    Comment: Occ on celebrations or holiday   Drug use: Yes    Types: Marijuana     Allergies   Patient has no known allergies.   Review of Systems Review of Systems  Constitutional:  Negative for chills and fever.  Gastrointestinal:  Negative for abdominal pain, nausea and vomiting.  Genitourinary:  Positive for vaginal  discharge. Negative for dysuria, flank pain, frequency, hematuria and pelvic pain.  Skin:  Negative for color change and rash.  Neurological:  Negative for syncope.  All other systems reviewed and are negative.   Physical Exam Triage Vital Signs ED Triage Vitals  Enc Vitals Group     BP      Pulse      Resp      Temp      Temp src      SpO2      Weight      Height      Head Circumference      Peak Flow      Pain Score      Pain Loc      Pain Edu?      Excl. in [place]?    No data found.  Updated Vital Signs BP 131/83 (BP Location: Left Arm)   Pulse 71   Temp 98.4 F (36.9 C) (Oral)   Resp 16   Ht 5\' 4"  (1.626 m)   Wt 179 lb 14.3 oz (81.6 kg)   SpO2 100%   BMI 30.88 kg/m   Visual Acuity Right Eye Distance:   Left Eye Distance:   Bilateral Distance:    Right Eye Near:   Left Eye Near:    Bilateral Near:     Physical Exam Vitals and nursing note reviewed.  Constitutional:      General: She is not in acute distress.    Appearance: Normal appearance. She is well-developed. She is not ill-appearing.  HENT:     Mouth/Throat:     Mouth: Mucous membranes are moist.  Cardiovascular:     Rate and Rhythm: Normal rate and regular rhythm.     Heart sounds: Normal heart sounds.  Pulmonary:     Effort: Pulmonary effort is normal. No respiratory distress.     Breath sounds: Normal breath sounds.  Abdominal:     Palpations: Abdomen is soft.     Tenderness: There is no abdominal tenderness. There is no right CVA tenderness, left CVA tenderness, guarding or rebound.  Musculoskeletal:     Cervical back: Neck supple.  Skin:    General: Skin is warm and dry.  Neurological:     Mental Status: She is alert.  Psychiatric:        Mood and Affect: Mood normal.        Behavior: Behavior normal.     UC Treatments / Results  Labs (all labs ordered are  listed, but only abnormal results are displayed) Labs Reviewed  POCT URINALYSIS DIP (MANUAL ENTRY) - Abnormal; Notable for  the following components:      Result Value   Leukocytes, UA Trace (*)    All other components within normal limits  POCT URINE PREGNANCY  CERVICOVAGINAL ANCILLARY ONLY    EKG   Radiology No results found.  Procedures Procedures (including critical care time)  Medications Ordered in UC Medications - No data to display  Initial Impression / Assessment and Plan / UC Course  I have reviewed the triage vital signs and the nursing notes.  Pertinent labs & imaging results that were available during my care of the patient were reviewed by me and considered in my medical decision making (see chart for details).   Vaginal itching, vaginal discharge, STD screening.  Patient obtained vaginal self swab for testing.  Treating with Metro Gel; patient prefers gel rather than pills.  Discussed that we will call if test results are positive.  Discussed that she may require additional treatment at that time.  Discussed that sexual partner(s) may also require treatment.  Instructed patient to abstain from sexual activity for at least 7 days.  Instructed her to follow-up with her PCP or gynecologist if her symptoms are not improving.  Patient agrees to plan of care.   Final Clinical Impressions(s) / UC Diagnoses   Final diagnoses:  Vaginal itching  Vaginal discharge  Screening for STD (sexually transmitted disease)     Discharge Instructions      Use the Metro Gel as directed.      Your vaginal tests are pending.  If your test results are positive, we will call you.  You and your sexual partner(s) may require treatment at that time.  Do not have sexual activity for at least 7 days.    Follow up with your primary care provider if your symptoms are not improving.         ED Prescriptions     Medication Sig Dispense Auth. Provider   metroNIDAZOLE (METROGEL VAGINAL) 0.75 % vaginal gel Place 1 Applicatorful vaginally at bedtime for 5 days. 50 g Mickie Bail, NP      PDMP not  reviewed this encounter.   Mickie Bail, NP 08/08/21 1742

## 2021-08-09 LAB — CERVICOVAGINAL ANCILLARY ONLY
Bacterial Vaginitis (gardnerella): POSITIVE — AB
Candida Glabrata: NEGATIVE
Candida Vaginitis: POSITIVE — AB
Chlamydia: NEGATIVE
Comment: NEGATIVE
Comment: NEGATIVE
Comment: NEGATIVE
Comment: NEGATIVE
Comment: NEGATIVE
Comment: NORMAL
Neisseria Gonorrhea: NEGATIVE
Trichomonas: NEGATIVE

## 2021-08-10 ENCOUNTER — Telehealth: Payer: Self-pay | Admitting: Emergency Medicine

## 2021-08-10 ENCOUNTER — Other Ambulatory Visit: Payer: Self-pay

## 2021-08-10 MED ORDER — METRONIDAZOLE 0.75 % VA GEL
VAGINAL | 0 refills | Status: DC
  Filled 2021-08-10: qty 70, 5d supply, fill #0

## 2021-08-10 MED ORDER — FLUCONAZOLE 150 MG PO TABS
150.0000 mg | ORAL_TABLET | Freq: Once | ORAL | 1 refills | Status: AC
  Filled 2021-08-10: qty 2, 3d supply, fill #0

## 2021-08-10 NOTE — Telephone Encounter (Signed)
Patient would like medication sent into a different pharmacy.  Testing shows BV and yeast.  Originally sent home with MetroGel.  Will prescribe MetroGel to pharmacy on record and DiflucanPatient would like medication sent into a different pharmacy.  Testing shows BV and yeast.  Originally sent home with MetroGel.  Will prescribe MetroGel to pharmacy on record and Diflucan

## 2021-12-12 ENCOUNTER — Emergency Department
Admission: EM | Admit: 2021-12-12 | Discharge: 2021-12-12 | Disposition: A | Discharge status: Home / Self Care | Payer: Self-pay | Attending: Emergency Medicine | Admitting: Emergency Medicine

## 2021-12-12 ENCOUNTER — Other Ambulatory Visit: Payer: Self-pay

## 2021-12-12 ENCOUNTER — Emergency Department: Payer: Self-pay

## 2021-12-12 DIAGNOSIS — Y9379 Activity, other specified sports and athletics: Secondary | ICD-10-CM | POA: Insufficient documentation

## 2021-12-12 DIAGNOSIS — X509XXA Other and unspecified overexertion or strenuous movements or postures, initial encounter: Secondary | ICD-10-CM | POA: Insufficient documentation

## 2021-12-12 DIAGNOSIS — S76112A Strain of left quadriceps muscle, fascia and tendon, initial encounter: Secondary | ICD-10-CM | POA: Insufficient documentation

## 2021-12-12 MED ORDER — NAPROXEN 500 MG PO TABS
500.0000 mg | ORAL_TABLET | Freq: Once | ORAL | Status: AC
  Administered 2021-12-12: 500 mg via ORAL
  Filled 2021-12-12: qty 1

## 2021-12-12 MED ORDER — DICLOFENAC SODIUM 75 MG PO TBEC: 75.0000 mg | DELAYED_RELEASE_TABLET | Freq: Two times a day (BID) | ORAL | 0 refills | Status: AC

## 2021-12-12 MED ORDER — CYCLOBENZAPRINE HCL 5 MG PO TABS: 5.0000 mg | ORAL_TABLET | Freq: Three times a day (TID) | ORAL | 0 refills | Status: AC | PRN

## 2021-12-12 NOTE — ED Notes (Signed)
See triage note.  Presents with pain to left leg  States she played kick ball yesterday  Having pain from groin into knee and lower leg  Ambulates with slight limp d/t pain  Min swelling noted

## 2021-12-12 NOTE — ED Provider Notes (Signed)
[facility] Emergency Department Provider Note     Event Date/Time   First MD Initiated Contact with Patient 12/12/21 1540     (approximate)   History   Knee Pain   HPI  Pamela Kent is a 31 y.o. female presents to the ED for evaluation of left thigh pain.  Patient reports an injury that occurred while playing kickball.  She presents with left thigh pain, anteriorly and muscle tightness. She notes that the left thigh stiffness is making her previously injured right knee hurt. She has been wearing a hinged knee brace on the left knee.  No other injury reported at this time.  No history of recurrent or chronic knee pain problems on the left.   Physical Exam   Triage Vital Signs: ED Triage Vitals  Enc Vitals Group     BP 12/12/21 1453 111/85     Pulse Rate 12/12/21 1453 67     Resp 12/12/21 1453 18     Temp 12/12/21 1453 98 F (36.7 C)     Temp src --      SpO2 12/12/21 1453 96 %     Weight 12/12/21 1454 179 lb (81.2 kg)     Height 12/12/21 1454 5\' 4"  (1.626 m)     Head Circumference --      Peak Flow --      Pain Score 12/12/21 1453 7     Pain Loc --      Pain Edu? --      Excl. in [place]? --     Most recent vital signs: Vitals:   12/12/21 1453  BP: 111/85  Pulse: 67  Resp: 18  Temp: 98 F (36.7 C)  SpO2: 96%    General Awake, no distress.  CV:  Good peripheral perfusion.  RESP:  Normal effort.  ABD:  No distention.  MSK:  Left thigh with some palpable muscle spasm and tightness as well as some knots appreciated.  Patient with normal quadricep extension on exam.  No patellar ballottement or laxity is noted.  No left knee joint effusion is noted.  Popliteal space fullness is noted.  Calf and Achilles are nontender and intact distally.  No evidence of valgus or varus joint stress of the left knee. NEURO: Cranial nerves II through XII grossly intact.  ED Results / Procedures / Treatments   Labs (all labs ordered are listed, but only  abnormal results are displayed) Labs Reviewed - No data to display   EKG   RADIOLOGY  I personally viewed and evaluated these images as part of my medical decision making, as well as reviewing the written report by the radiologist.  ED Provider Interpretation: no acute findings  DG Knee Complete 4 Views Left  Result Date: 12/12/2021 CLINICAL DATA:  LEFT knee pain, injured while playing kickball EXAM: LEFT KNEE - COMPLETE 4+ VIEW COMPARISON:  None FINDINGS: Osseous mineralization normal. Joint spaces preserved. No fracture, dislocation, or bone destruction. Mild scattered subcutaneous soft tissue edema. No joint effusion. IMPRESSION: No acute osseous abnormalities. Electronically Signed   By: Lavonia Dana M.D.   On: 12/12/2021 15:19     PROCEDURES:  Critical Care performed: No  Procedures   MEDICATIONS ORDERED IN ED: Medications  naproxen (NAPROSYN) tablet 500 mg (500 mg Oral Given 12/12/21 1624)     IMPRESSION / MDM / ASSESSMENT AND PLAN / ED COURSE  I reviewed the triage vital signs and the nursing notes.  Differential diagnosis includes, but is not limited to, quadricep tear, quadriceps strain, patellofemoral knee pain,  Patient's presentation is most consistent with acute complicated illness / injury requiring diagnostic workup.  Patient with a reassuring exam and work-up overall.  No evidence of any internal derangement on exam.  No x-ray evidence of any acute fracture or dislocation, based on my evaluation interpretation of the images.  This system radiology report.  Patient's diagnosis is consistent with left quadricep strain. Patient will be discharged home with prescriptions for diclofenac and Flexeril.  Patient is placed in a [employer] thigh wrap.  Patient is to follow up with Ortho as needed or otherwise directed. Patient is given ED precautions to return to the ED for any worsening or new symptoms.     FINAL CLINICAL IMPRESSION(S) /  ED DIAGNOSES   Final diagnoses:  Quadriceps strain, left, initial encounter     Rx / DC Orders   ED Discharge Orders          Ordered    cyclobenzaprine (FLEXERIL) 5 MG tablet  3 times daily PRN        12/12/21 1619    diclofenac (VOLTAREN) 75 MG EC tablet  2 times daily        12/12/21 1619             Note:  This document was prepared using Dragon voice recognition software and may include unintentional dictation errors.    Lissa Hoard, PA-C 12/12/21 1721    Concha Se, MD 12/12/21 2352

## 2021-12-12 NOTE — Discharge Instructions (Addendum)
Your exam and XR do not show any bony injury or fracture.  Your symptoms are consistent with a quadricep muscle strain.  Wear the Ace bandage around the thigh for support.  Rest with the leg and evaluate as needed.  Take the prescription meds as directed.  Follow-up with Ortho for ongoing symptoms.  Return to the ED if needed.

## 2021-12-12 NOTE — ED Triage Notes (Signed)
Pt arrives with c/o left knee pain. Per pt, she injured the left knee while playing kickball.

## 2022-05-10 ENCOUNTER — Other Ambulatory Visit: Payer: Self-pay

## 2022-05-12 ENCOUNTER — Other Ambulatory Visit: Payer: Self-pay

## 2022-05-12 ENCOUNTER — Ambulatory Visit (LOCAL_COMMUNITY_HEALTH_CENTER): Payer: Self-pay

## 2022-05-12 DIAGNOSIS — Z111 Encounter for screening for respiratory tuberculosis: Secondary | ICD-10-CM

## 2022-05-15 ENCOUNTER — Ambulatory Visit: Payer: Self-pay

## 2022-05-15 DIAGNOSIS — Z111 Encounter for screening for respiratory tuberculosis: Secondary | ICD-10-CM

## 2022-05-15 LAB — TB SKIN TEST
Induration: 0 mm
TB Skin Test: NEGATIVE

## 2022-05-26 ENCOUNTER — Other Ambulatory Visit: Payer: Self-pay

## 2022-10-05 ENCOUNTER — Emergency Department
Admission: EM | Admit: 2022-10-05 | Discharge: 2022-10-05 | Disposition: A | Discharge status: Home / Self Care | Payer: Self-pay | Attending: Emergency Medicine | Admitting: Emergency Medicine

## 2022-10-05 ENCOUNTER — Other Ambulatory Visit: Payer: Self-pay

## 2022-10-05 ENCOUNTER — Encounter: Payer: Self-pay | Admitting: Emergency Medicine

## 2022-10-05 DIAGNOSIS — E860 Dehydration: Secondary | ICD-10-CM | POA: Insufficient documentation

## 2022-10-05 DIAGNOSIS — R112 Nausea with vomiting, unspecified: Secondary | ICD-10-CM | POA: Insufficient documentation

## 2022-10-05 LAB — COMPREHENSIVE METABOLIC PANEL
ALT: 46 U/L — ABNORMAL HIGH (ref 0–44)
AST: 34 U/L (ref 15–41)
Albumin: 3.7 g/dL (ref 3.5–5.0)
Alkaline Phosphatase: 54 U/L (ref 38–126)
Anion gap: 9 (ref 5–15)
BUN: 12 mg/dL (ref 6–20)
CO2: 19 mmol/L — ABNORMAL LOW (ref 22–32)
Calcium: 8.4 mg/dL — ABNORMAL LOW (ref 8.9–10.3)
Chloride: 112 mmol/L — ABNORMAL HIGH (ref 98–111)
Creatinine, Ser: 0.91 mg/dL (ref 0.44–1.00)
GFR, Estimated: >60 mL/min (ref >60)
Glucose, Bld: 93 mg/dL (ref 70–99)
Potassium: 3.7 mmol/L (ref 3.5–5.1)
Sodium: 140 mmol/L (ref 135–145)
Total Bilirubin: 0.5 mg/dL (ref 0.3–1.2)
Total Protein: 7.5 g/dL (ref 6.5–8.1)

## 2022-10-05 LAB — URINALYSIS, ROUTINE W REFLEX MICROSCOPIC
Bacteria, UA: NONE SEEN
Bilirubin Urine: NEGATIVE
Glucose, UA: NEGATIVE mg/dL
Ketones, ur: 5 mg/dL — AB
Leukocytes,Ua: NEGATIVE
Nitrite: NEGATIVE
Protein, ur: NEGATIVE mg/dL
Specific Gravity, Urine: 1.024 (ref 1.005–1.030)
pH: 5 (ref 5.0–8.0)

## 2022-10-05 LAB — LIPASE, BLOOD: Lipase: 25 U/L (ref 11–51)

## 2022-10-05 LAB — ETHANOL: Alcohol, Ethyl (B): 15 mg/dL — ABNORMAL HIGH (ref <10)

## 2022-10-05 MED ORDER — ONDANSETRON 8 MG PO TBDP: 8.0000 mg | ORAL_TABLET | Freq: Three times a day (TID) | ORAL | 0 refills | Status: AC | PRN

## 2022-10-05 MED ORDER — FAMOTIDINE IN NACL 20-0.9 MG/50ML-% IV SOLN
20.0000 mg | Freq: Once | INTRAVENOUS | Status: AC
  Administered 2022-10-05: 20 mg via INTRAVENOUS
  Filled 2022-10-05: qty 50

## 2022-10-05 MED ORDER — SODIUM CHLORIDE 0.9 % IV BOLUS
1000.0000 mL | Freq: Once | INTRAVENOUS | Status: AC
  Administered 2022-10-05: 1000 mL via INTRAVENOUS

## 2022-10-05 NOTE — ED Triage Notes (Signed)
BIB [facility] from home for NVD, onset 1300. Denies fever, sick contacts, others in family with similar sx. Relates sx to ETOH. Last ETOH last night 1am. Retched/ gagged in triage and was incontinent of urine. Alert, NAD, calm, interactive, nauseated, shaking. EMS gave zofran and NS 500cc IVF. CBG 98. "Vx10, Dx10".  Asking for pregnancy and STI test. "Took Plan B 3-4 weeks ago". Describes LMP as not usual, LMP Saturday.

## 2022-10-05 NOTE — ED Notes (Addendum)
Unable to give urine sample in triage d/t stress incontinence.

## 2022-10-05 NOTE — ED Provider Notes (Signed)
[facility] Provider Note   Event Date/Time   First MD Initiated Contact with Patient 10/05/22 1745     (approximate) History  Emesis  HPI Pamela Kent is a 32 y.o. female who presents complaining of of headache, nausea, and vomiting after heavy alcohol use last night.  Patient states that this is similar to previous alcohol withdrawal episodes that she has had in the past.  Patient states that she had a family member die and a few of her family and her drink too much last evening. ROS: Patient currently denies any vision changes, tinnitus, difficulty speaking, facial droop, sore throat, chest pain, shortness of breath, abdominal pain, diarrhea, dysuria, or weakness/numbness/paresthesias in any extremity   Physical Exam  Triage Vital Signs: ED Triage Vitals  Encounter Vitals Group     BP 10/05/22 1618 (!) 134/104     Systolic BP Percentile --      Diastolic BP Percentile --      Pulse Rate 10/05/22 1618 75     Resp 10/05/22 1618 16     Temp 10/05/22 1618 98.2 F (36.8 C)     Temp src --      SpO2 10/05/22 1618 99 %     Weight 10/05/22 1624 190 lb (86.2 kg)     Height 10/05/22 1752 5\' 4"  (1.626 m)     Head Circumference --      Peak Flow --      Pain Score 10/05/22 1624 7     Pain Loc --      Pain Education --      Exclude from Growth Chart --    Most recent vital signs: Vitals:   10/05/22 1618 10/05/22 1619  BP: (!) 134/104 (!) 138/100  Pulse: 75 88  Resp: 16   Temp: 98.2 F (36.8 C)   SpO2: 99% 99%   General: Awake, oriented x4. CV:  Good peripheral perfusion.  Resp:  Normal effort.  Abd:  No distention.  Other:  Middle-aged obese African-American female laying in bed in no acute distress ED Results / Procedures / Treatments  Labs (all labs ordered are listed, but only abnormal results are displayed) Labs Reviewed  COMPREHENSIVE METABOLIC PANEL - Abnormal; Notable for the following components:      Result Value   Chloride 112 (*)     CO2 19 (*)    Calcium 8.4 (*)    ALT 46 (*)    All other components within normal limits  ETHANOL - Abnormal; Notable for the following components:   Alcohol, Ethyl (B) 15 (*)    All other components within normal limits  LIPASE, BLOOD  URINALYSIS, ROUTINE W REFLEX MICROSCOPIC  POC URINE PREG, ED  PROCEDURES: Critical Care performed: No Procedures MEDICATIONS ORDERED IN ED: Medications  famotidine (PEPCID) IVPB 20 mg premix (20 mg Intravenous New Bag/Given 10/05/22 1850)  sodium chloride 0.9 % bolus 1,000 mL (1,000 mLs Intravenous New Bag/Given 10/05/22 1851)   IMPRESSION / MDM / ASSESSMENT AND PLAN / ED COURSE  I reviewed the triage vital signs and the nursing notes.                             The patient is on the cardiac monitor to evaluate for evidence of arrhythmia and/or significant heart rate changes. Patient's presentation is most consistent with acute presentation with potential threat to life or bodily function. Patient presents for acute nausea/vomiting The  cause of the patients symptoms is not clear (likely alcohol), but the patient is overall well appearing and is suspected to have a transient course of illness.  Given History and Exam there does not appear to be an emergent cause of the symptoms such as small bowel obstruction, coronary syndrome, bowel ischemia, DKA, pancreatitis, appendicitis, other acute abdomen or other emergent problem.  Reassessment: After treatment, the patient is feeling much better, tolerating PO fluids, and shows no signs of dehydration.   Disposition: Discharge home with prompt primary care physician follow up in the next 48 hours. Strict return precautions discussed.   FINAL CLINICAL IMPRESSION(S) / ED DIAGNOSES   Final diagnoses:  Nausea and vomiting, unspecified vomiting type  Dehydration   Rx / DC Orders   ED Discharge Orders          Ordered    ondansetron (ZOFRAN-ODT) 8 MG disintegrating tablet  Every 8 hours PRN         10/05/22 1834           Note:  This document was prepared using Dragon voice recognition software and may include unintentional dictation errors.   Merwyn Katos, MD 10/05/22 [phone]

## 2023-09-13 ENCOUNTER — Ambulatory Visit
Admission: EM | Admit: 2023-09-13 | Discharge: 2023-09-13 | Disposition: A | Discharge status: Home / Self Care | Payer: Self-pay | Attending: Family Medicine | Admitting: Family Medicine

## 2023-09-13 ENCOUNTER — Encounter: Payer: Self-pay | Admitting: Emergency Medicine

## 2023-09-13 ENCOUNTER — Other Ambulatory Visit: Payer: Self-pay

## 2023-09-13 DIAGNOSIS — J039 Acute tonsillitis, unspecified: Secondary | ICD-10-CM

## 2023-09-13 DIAGNOSIS — N760 Acute vaginitis: Secondary | ICD-10-CM

## 2023-09-13 LAB — POCT RAPID STREP A (OFFICE): Rapid Strep A Screen: NEGATIVE

## 2023-09-13 MED ORDER — LIDOCAINE VISCOUS HCL 2 % MT SOLN: 10.0000 mL | OROMUCOSAL | 0 refills | Status: AC | PRN

## 2023-09-13 MED ORDER — AMOXICILLIN 875 MG PO TABS: 875.0000 mg | ORAL_TABLET | Freq: Two times a day (BID) | ORAL | 0 refills | Status: AC

## 2023-09-13 NOTE — ED Triage Notes (Signed)
 Pt reports headache since Monday, sore throat and painful swallowing since Tuesday. Pt denies any abd pain, nausea, fever.   Pt also reports vaginal itching, vaginal odor since Monday as well. Reports last had blood work for possible STD in June. Last unprotected intercourse on Sunday.

## 2023-09-13 NOTE — Discharge Instructions (Signed)
 We will be in touch with your vaginal swab results, and in the meantime, you may try boric acid vaginal suppositories daily as needed, female health probiotics, avoidance of scented soaps or products.

## 2023-09-13 NOTE — ED Provider Notes (Signed)
 [facility]    CSN: [id number] Arrival date & time: 09/13/23  0904      History   Chief Complaint Chief Complaint  Patient presents with   Sore Throat    HPI Pamela Kent is a 33 y.o. female.   Patient presenting today with 4-day history of headache, sore swollen throat with white patches.  Denies congestion, cough, fever, chest pain, shortness of breath, abdominal pain, vomiting, diarrhea.  So far not trying anything over-the-counter for the symptoms.  No known sick contacts recently.  She is also having some vaginal itching and irritation for the past day and wanting STD testing.  Last unprotected intercourse was on Sunday.  Denies rashes, lesions, pelvic pain, urinary symptoms.    Past Medical History:  Diagnosis Date   Depression    Family history of pancreatic cancer    7/22 cancer genetic testing letter sent   PCOS (polycystic ovarian syndrome)    PMDD (premenstrual dysphoric disorder)    PTSD (post-traumatic stress disorder)     Patient Active Problem List   Diagnosis Date Noted   Severe recurrent major depression without psychotic features (HCC) 11/24/2015   PTSD (post-traumatic stress disorder) 11/24/2015    Past Surgical History:  Procedure Laterality Date   CYSTECTOMY     WISDOM TOOTH EXTRACTION      OB History     Gravida  1   Para      Term      Preterm      AB  1   Living         SAB      IAB  1   Ectopic      Multiple      Live Births               Home Medications    Prior to Admission medications   Medication Sig Start Date End Date Taking? Authorizing Provider  amoxicillin (AMOXIL) 875 MG tablet Take 1 tablet (875 mg total) by mouth 2 (two) times daily. 09/13/23  Yes Stuart Vernell Norris, PA-C  lidocaine (XYLOCAINE) 2 % solution Use as directed 10 mLs in the mouth or throat every 3 (three) hours as needed. 09/13/23  Yes Stuart Vernell Norris, PA-C  cyclobenzaprine  (FLEXERIL ) 5 MG tablet Take 1 tablet (5  mg total) by mouth 3 (three) times daily as needed. 12/12/21   Menshew, Candida LULLA Kings, PA-C  LORazepam  (ATIVAN ) 0.5 MG tablet Take 1 tablet (0.5 mg total) by mouth daily as needed for anxiety. 03/30/21     ondansetron  (ZOFRAN -ODT) 8 MG disintegrating tablet Take 1 tablet (8 mg total) by mouth every 8 (eight) hours as needed for nausea or vomiting. 10/05/22   Bradler, Evan K, MD    Family History Family History  Problem Relation Age of Onset   Brain cancer Mother    Pancreatic cancer Brother     Social History Social History   Tobacco Use   Smoking status: Never   Smokeless tobacco: Never  Substance Use Topics   Alcohol use: Yes    Comment: Occ on celebrations or holiday   Drug use: Yes    Types: Marijuana     Allergies   Patient has no known allergies.   Review of Systems Review of Systems Per HPI  Physical Exam Triage Vital Signs ED Triage Vitals  Encounter Vitals Group     BP 09/13/23 0929 129/82     Girls Systolic BP Percentile --  Girls Diastolic BP Percentile --      Boys Systolic BP Percentile --      Boys Diastolic BP Percentile --      Pulse Rate 09/13/23 0929 71     Resp 09/13/23 0929 20     Temp 09/13/23 0929 98.8 F (37.1 C)     Temp Source 09/13/23 0929 Oral     SpO2 09/13/23 0929 97 %     Weight --      Height --      Head Circumference --      Peak Flow --      Pain Score 09/13/23 0932 8     Pain Loc --      Pain Education --      Exclude from Growth Chart --    No data found.  Updated Vital Signs BP 129/82 (BP Location: Right Arm)   Pulse 71   Temp 98.8 F (37.1 C) (Oral)   Resp 20   LMP 08/23/2023 (Approximate)   SpO2 97%   Breastfeeding No   Visual Acuity Right Eye Distance:   Left Eye Distance:   Bilateral Distance:    Right Eye Near:   Left Eye Near:    Bilateral Near:     Physical Exam Vitals and nursing note reviewed.  Constitutional:      Appearance: Normal appearance. She is not ill-appearing.  HENT:      Head: Atraumatic.     Mouth/Throat:     Mouth: Mucous membranes are moist.     Pharynx: Oropharyngeal exudate and posterior oropharyngeal erythema present.     Comments: Moderate tonsillar erythema, edema, copious exudates.  Uvula midline, oral airway patent Eyes:     Extraocular Movements: Extraocular movements intact.     Conjunctiva/sclera: Conjunctivae normal.  Cardiovascular:     Rate and Rhythm: Normal rate and regular rhythm.     Heart sounds: Normal heart sounds.  Pulmonary:     Effort: Pulmonary effort is normal.     Breath sounds: Normal breath sounds.  Abdominal:     General: Bowel sounds are normal. There is no distension.     Palpations: Abdomen is soft.     Tenderness: There is no abdominal tenderness. There is no right CVA tenderness, left CVA tenderness or guarding.  Genitourinary:    Comments: GU exam deferred, self swab performed Musculoskeletal:        General: Normal range of motion.     Cervical back: Normal range of motion and neck supple.  Lymphadenopathy:     Cervical: Cervical adenopathy present.  Skin:    General: Skin is warm and dry.  Neurological:     Mental Status: She is alert and oriented to person, place, and time.  Psychiatric:        Mood and Affect: Mood normal.        Thought Content: Thought content normal.        Judgment: Judgment normal.      UC Treatments / Results  Labs (all labs ordered are listed, but only abnormal results are displayed) Labs Reviewed  POCT RAPID STREP A (OFFICE)    EKG   Radiology No results found.  Procedures Procedures (including critical care time)  Medications Ordered in UC Medications - No data to display  Initial Impression / Assessment and Plan / UC Course  I have reviewed the triage vital signs and the nursing notes.  Pertinent labs & imaging results that were available during my care of  the patient were reviewed by me and considered in my medical decision making (see chart for  details).     Vitals within normal limits, rapid strep negative but given exam findings and symptoms will cover for bacterial tonsillitis with Amoxil, viscous lidocaine.  Vaginal swab pending for STIs and other causes of vaginitis, discussed supportive home care and return precautions.  Final Clinical Impressions(s) / UC Diagnoses   Final diagnoses:  Acute tonsillitis, unspecified etiology  Acute vaginitis     Discharge Instructions      We will be in touch with your vaginal swab results, and in the meantime, you may try boric acid vaginal suppositories daily as needed, female health probiotics, avoidance of scented soaps or products.    ED Prescriptions     Medication Sig Dispense Auth. Provider   amoxicillin (AMOXIL) 875 MG tablet Take 1 tablet (875 mg total) by mouth 2 (two) times daily. 20 tablet Stuart Vernell Norris, PA-C   lidocaine (XYLOCAINE) 2 % solution Use as directed 10 mLs in the mouth or throat every 3 (three) hours as needed. 100 mL Stuart Vernell Norris, [place]      PDMP not reviewed this encounter.   Stuart Vernell Norris, [place] 09/13/23 1013

## 2024-01-23 ENCOUNTER — Ambulatory Visit
Admission: EM | Admit: 2024-01-23 | Discharge: 2024-01-23 | Disposition: A | Discharge status: Home / Self Care | Attending: Emergency Medicine | Admitting: Emergency Medicine

## 2024-01-23 ENCOUNTER — Encounter: Payer: Self-pay | Admitting: Emergency Medicine

## 2024-01-23 DIAGNOSIS — J029 Acute pharyngitis, unspecified: Secondary | ICD-10-CM | POA: Diagnosis not present

## 2024-01-23 DIAGNOSIS — J069 Acute upper respiratory infection, unspecified: Secondary | ICD-10-CM | POA: Diagnosis not present

## 2024-01-23 LAB — POCT RAPID STREP A (OFFICE): Rapid Strep A Screen: NEGATIVE

## 2024-01-23 MED ORDER — PREDNISONE 10 MG (21) PO TBPK: ORAL_TABLET | Freq: Every day | ORAL | 0 refills | Status: AC

## 2024-01-23 MED ORDER — AZITHROMYCIN 250 MG PO TABS: 250.0000 mg | ORAL_TABLET | Freq: Every day | ORAL | 0 refills | Status: AC

## 2024-01-23 NOTE — ED Provider Notes (Signed)
 Pamela Kent    CSN: [id number] Arrival date & time: 01/23/24  0954      History   Chief Complaint Chief Complaint  Patient presents with   Otalgia   Sore Throat   Headache    HPI Pamela Kent is a 33 y.o. female.    Patient presents for evaluation of nasal congestion, rhinorrhea, left-sided ear pain, postnasal drip, sinus pressure to the bilateral cheeks, sore throat, productive cough and intermittent headaches present for 3 days.  Has noticed some blood within the mucus.  Endorses overnight having trouble breathing causing her to wake up to catch her breath, attributes this to a dry throat.  Tolerable to food and liquids.  Known sick contact at work, coworker has bronchitis.  Has attempted use of ibuprofen and 1 dose of an old prescription of amoxicillin .  Denies fever, or wheezing.  Denies respiratory history, non-smoker.      Past Medical History:  Diagnosis Date   Depression    Family history of pancreatic cancer    7/22 cancer genetic testing letter sent   PCOS (polycystic ovarian syndrome)    PMDD (premenstrual dysphoric disorder)    PTSD (post-traumatic stress disorder)     Patient Active Problem List   Diagnosis Date Noted   Severe recurrent major depression without psychotic features (HCC) 11/24/2015   PTSD (post-traumatic stress disorder) 11/24/2015    Past Surgical History:  Procedure Laterality Date   CYSTECTOMY     WISDOM TOOTH EXTRACTION      OB History     Gravida  1   Para      Term      Preterm      AB  1   Living         SAB      IAB  1   Ectopic      Multiple      Live Births               Home Medications    Prior to Admission medications   Medication Sig Start Date End Date Taking? Authorizing Provider  amoxicillin  (AMOXIL ) 875 MG tablet Take 1 tablet (875 mg total) by mouth 2 (two) times daily. 09/13/23   Stuart Vernell Norris, PA-C  cyclobenzaprine  (FLEXERIL ) 5 MG tablet Take 1 tablet (5 mg  total) by mouth 3 (three) times daily as needed. 12/12/21   Menshew, Candida LULLA Kings, PA-C  lidocaine  (XYLOCAINE ) 2 % solution Use as directed 10 mLs in the mouth or throat every 3 (three) hours as needed. 09/13/23   Stuart Vernell Norris, PA-C  LORazepam  (ATIVAN ) 0.5 MG tablet Take 1 tablet (0.5 mg total) by mouth daily as needed for anxiety. 03/30/21     ondansetron  (ZOFRAN -ODT) 8 MG disintegrating tablet Take 1 tablet (8 mg total) by mouth every 8 (eight) hours as needed for nausea or vomiting. 10/05/22   Bradler, Evan K, MD    Family History Family History  Problem Relation Age of Onset   Brain cancer Mother    Pancreatic cancer Brother     Social History Social History   Tobacco Use   Smoking status: Never   Smokeless tobacco: Never  Substance Use Topics   Alcohol use: Yes    Comment: Occ on celebrations or holiday   Drug use: Yes    Types: Marijuana     Allergies   Patient has no known allergies.   Review of Systems Review of Systems  Constitutional: Negative.  HENT:  Positive for congestion, ear pain, postnasal drip, rhinorrhea, sinus pressure and sore throat. Negative for dental problem, drooling, ear discharge, facial swelling, hearing loss, mouth sores, nosebleeds, sinus pain, sneezing, tinnitus, trouble swallowing and voice change.   Respiratory:  Positive for cough. Negative for apnea, choking, chest tightness, shortness of breath, wheezing and stridor.   Cardiovascular: Negative.   Gastrointestinal: Negative.   Neurological:  Positive for headaches. Negative for dizziness, tremors, seizures, syncope, facial asymmetry, speech difficulty, weakness (eating normal amount), light-headedness and numbness.     Physical Exam Triage Vital Signs ED Triage Vitals  Encounter Vitals Group     BP 01/23/24 1014 131/87     Girls Systolic BP Percentile --      Girls Diastolic BP Percentile --      Boys Systolic BP Percentile --      Boys Diastolic BP Percentile --       Pulse Rate 01/23/24 1014 62     Resp 01/23/24 1014 18     Temp 01/23/24 1014 98.4 F (36.9 C)     Temp Source 01/23/24 1014 Oral     SpO2 01/23/24 1014 98 %     Weight --      Height --      Head Circumference --      Peak Flow --      Pain Score 01/23/24 1011 7     Pain Loc --      Pain Education --      Exclude from Growth Chart --    No data found.  Updated Vital Signs BP 131/87 (BP Location: Left Arm)   Pulse 62   Temp 98.4 F (36.9 C) (Oral)   Resp 18   LMP 01/13/2024 (Exact Date)   SpO2 98%   Visual Acuity Right Eye Distance:   Left Eye Distance:   Bilateral Distance:    Right Eye Near:   Left Eye Near:    Bilateral Near:     Physical Exam Constitutional:      Appearance: Normal appearance.  HENT:     Right Ear: Tympanic membrane and ear canal normal.     Left Ear: Tympanic membrane and ear canal normal.     Nose: Congestion present.     Mouth/Throat:     Pharynx: Posterior oropharyngeal erythema present. No oropharyngeal exudate.     Tonsils: No tonsillar exudate. 2+ on the right. 2+ on the left.  Eyes:     Extraocular Movements: Extraocular movements intact.  Cardiovascular:     Rate and Rhythm: Normal rate and regular rhythm.     Pulses: Normal pulses.     Heart sounds: Normal heart sounds.  Pulmonary:     Breath sounds: Normal breath sounds.  Neurological:     Mental Status: She is alert and oriented to person, place, and time. Mental status is at baseline.      UC Treatments / Results  Labs (all labs ordered are listed, but only abnormal results are displayed) Labs Reviewed  POCT RAPID STREP A (OFFICE) - Normal    EKG   Radiology No results found.  Procedures Procedures (including critical care time)  Medications Ordered in UC Medications - No data to display  Initial Impression / Assessment and Plan / UC Course  I have reviewed the triage vital signs and the nursing notes.  Pertinent labs & imaging results that were  available during my care of the patient were reviewed by me and considered in my  medical decision making (see chart for details).  Viral URI with cough, sore throat  Patient is in no signs of distress nor toxic appearing.  Vital signs are stable.  Low suspicion for pneumonia, pneumothorax or bronchitis and therefore will defer imaging.  Rapid strep test negative.  Etiology most likely viral, prescribed prednisone and watch and wait antibiotic placed at pharmacy May use additional over-the-counter medications as needed for supportive care.  May follow-up with urgent care as needed if symptoms persist or worsen.  Note given.   Final Clinical Impressions(s) / UC Diagnoses   Final diagnoses:  Sore throat   Discharge Instructions   None    ED Prescriptions   None    PDMP not reviewed this encounter.   Teresa Shelba SAUNDERS, NP 01/23/24 1120

## 2024-01-23 NOTE — Discharge Instructions (Addendum)
 Your symptoms today are most likely being caused by a virus and should steadily improve in time it can take up to 7 to 10 days before you truly start to see a turnaround however things will get better, if no improvement seen by Sunday may begin use of antibiotic which will already be at pharmacy  In the meantime begin prednisone every morning with food to reduce internal inflammation and help with pain, avoid ibuprofen during treatment  Blood seen in the mucus typically is to dryness of the airway therefore ensure that you are not in warm stuffy environments and use humidifier or vaporizer to further assist with symptom    You can take Tylenol  and/or Ibuprofen as needed for fever reduction and pain relief.   For cough: honey 1/2 to 1 teaspoon (you can dilute the honey in water or another fluid).  You can also use guaifenesin and dextromethorphan for cough. You can use a humidifier for chest congestion and cough.  If you don't have a humidifier, you can sit in the bathroom with the hot shower running.      For sore throat: try warm salt water gargles, cepacol lozenges, throat spray, warm tea or water with lemon/honey, popsicles or ice, or OTC cold relief medicine for throat discomfort.   For congestion: take a daily anti-histamine like Zyrtec, Claritin, and a oral decongestant, such as pseudoephedrine.  You can also use Flonase 1-2 sprays in each nostril daily.   It is important to stay hydrated: drink plenty of fluids (water, gatorade/powerade/pedialyte, juices, or teas) to keep your throat moisturized and help further relieve irritation/discomfort.

## 2024-01-23 NOTE — ED Triage Notes (Signed)
 Patient complains of left ear pain, left sided headache, sore throat and post nasal drip x 3 days. Patient took 1 pill of amoxicillin  from an old prescription on Monday. Patient has also took Ibuprofen with mild relief last dose on yesterday. Rates left ear pain 7/10, headache 8/10 and sore throat 6/10.

## 2024-02-05 ENCOUNTER — Telehealth: Payer: Self-pay

## 2024-02-05 NOTE — Telephone Encounter (Signed)
 Patient contacted Urgent Care regarding prescription for antibiotic from visit 11/12.   [facility] contacted by this [facility]. Prescription ready for patient to pick up. Patient verified understanding. No further questions at this time.
